# Patient Record
Sex: Female | Born: 1965 | Race: Black or African American | Hispanic: No | Marital: Single | Smoking: Former smoker
Health system: Southern US, Community
[De-identification: ages and names within clinical notes are randomized; demographics above are authoritative.]

## PROBLEM LIST (undated history)

## (undated) DIAGNOSIS — F419 Anxiety disorder, unspecified: Secondary | ICD-10-CM

## (undated) DIAGNOSIS — M1990 Unspecified osteoarthritis, unspecified site: Secondary | ICD-10-CM

## (undated) DIAGNOSIS — R519 Headache, unspecified: Secondary | ICD-10-CM

## (undated) DIAGNOSIS — D649 Anemia, unspecified: Secondary | ICD-10-CM

## (undated) DIAGNOSIS — R634 Abnormal weight loss: Secondary | ICD-10-CM

## (undated) DIAGNOSIS — F4310 Post-traumatic stress disorder, unspecified: Secondary | ICD-10-CM

## (undated) DIAGNOSIS — S060XAA Concussion with loss of consciousness status unknown, initial encounter: Secondary | ICD-10-CM

## (undated) DIAGNOSIS — F329 Major depressive disorder, single episode, unspecified: Secondary | ICD-10-CM

## (undated) DIAGNOSIS — R531 Weakness: Secondary | ICD-10-CM

## (undated) DIAGNOSIS — S060X9A Concussion with loss of consciousness of unspecified duration, initial encounter: Secondary | ICD-10-CM

## (undated) DIAGNOSIS — G43909 Migraine, unspecified, not intractable, without status migrainosus: Secondary | ICD-10-CM

## (undated) DIAGNOSIS — F32A Depression, unspecified: Secondary | ICD-10-CM

## (undated) DIAGNOSIS — K635 Polyp of colon: Secondary | ICD-10-CM

## (undated) DIAGNOSIS — R51 Headache: Secondary | ICD-10-CM

## (undated) DIAGNOSIS — R110 Nausea: Secondary | ICD-10-CM

## (undated) DIAGNOSIS — K6289 Other specified diseases of anus and rectum: Secondary | ICD-10-CM

## (undated) DIAGNOSIS — I10 Essential (primary) hypertension: Secondary | ICD-10-CM

## (undated) DIAGNOSIS — K5730 Diverticulosis of large intestine without perforation or abscess without bleeding: Secondary | ICD-10-CM

## (undated) DIAGNOSIS — K449 Diaphragmatic hernia without obstruction or gangrene: Secondary | ICD-10-CM

## (undated) DIAGNOSIS — K602 Anal fissure, unspecified: Secondary | ICD-10-CM

## (undated) HISTORY — DX: Abnormal weight loss: R63.4

## (undated) HISTORY — PX: COLONOSCOPY: SHX174

## (undated) HISTORY — DX: Depression, unspecified: F32.A

## (undated) HISTORY — DX: Anal fissure, unspecified: K60.2

## (undated) HISTORY — DX: Headache: R51

## (undated) HISTORY — DX: Diverticulosis of large intestine without perforation or abscess without bleeding: K57.30

## (undated) HISTORY — DX: Nausea: R11.0

## (undated) HISTORY — DX: Anemia, unspecified: D64.9

## (undated) HISTORY — DX: Weakness: R53.1

## (undated) HISTORY — DX: Other specified diseases of anus and rectum: K62.89

## (undated) HISTORY — DX: Major depressive disorder, single episode, unspecified: F32.9

## (undated) HISTORY — DX: Post-traumatic stress disorder, unspecified: F43.10

## (undated) HISTORY — PX: ESOPHAGOGASTRODUODENOSCOPY: SHX1529

## (undated) HISTORY — DX: Diaphragmatic hernia without obstruction or gangrene: K44.9

## (undated) HISTORY — DX: Anxiety disorder, unspecified: F41.9

## (undated) HISTORY — DX: Unspecified osteoarthritis, unspecified site: M19.90

## (undated) HISTORY — DX: Essential (primary) hypertension: I10

## (undated) HISTORY — DX: Polyp of colon: K63.5

## (undated) HISTORY — DX: Migraine, unspecified, not intractable, without status migrainosus: G43.909

## (undated) HISTORY — DX: Headache, unspecified: R51.9

---

## 1998-06-17 ENCOUNTER — Inpatient Hospital Stay (HOSPITAL_COMMUNITY): Admission: AD | Admit: 1998-06-17 | Discharge: 1998-06-18 | Payer: Self-pay | Admitting: *Deleted

## 1999-07-07 ENCOUNTER — Encounter: Payer: Self-pay | Admitting: Emergency Medicine

## 1999-07-07 ENCOUNTER — Emergency Department (HOSPITAL_COMMUNITY): Admission: EM | Admit: 1999-07-07 | Discharge: 1999-07-08 | Payer: Self-pay | Admitting: Emergency Medicine

## 2000-01-05 ENCOUNTER — Emergency Department (HOSPITAL_COMMUNITY): Admission: EM | Admit: 2000-01-05 | Discharge: 2000-01-05 | Payer: Self-pay | Admitting: Emergency Medicine

## 2000-01-05 ENCOUNTER — Encounter: Payer: Self-pay | Admitting: Emergency Medicine

## 2000-11-26 ENCOUNTER — Emergency Department: Admission: EM | Admit: 2000-11-26 | Discharge: 2000-11-26 | Payer: Self-pay | Admitting: Emergency Medicine

## 2002-04-30 ENCOUNTER — Other Ambulatory Visit: Admission: RE | Admit: 2002-04-30 | Discharge: 2002-04-30 | Payer: Self-pay | Admitting: Internal Medicine

## 2003-02-11 ENCOUNTER — Emergency Department (HOSPITAL_COMMUNITY): Admission: EM | Admit: 2003-02-11 | Discharge: 2003-02-11 | Payer: Self-pay | Admitting: *Deleted

## 2003-02-20 ENCOUNTER — Observation Stay (HOSPITAL_COMMUNITY): Admission: RE | Admit: 2003-02-20 | Discharge: 2003-02-21 | Payer: Self-pay | Admitting: General Surgery

## 2003-02-20 ENCOUNTER — Encounter (INDEPENDENT_AMBULATORY_CARE_PROVIDER_SITE_OTHER): Payer: Self-pay | Admitting: Specialist

## 2003-03-22 HISTORY — PX: CHOLECYSTECTOMY: SHX55

## 2004-02-10 ENCOUNTER — Emergency Department (HOSPITAL_COMMUNITY): Admission: EM | Admit: 2004-02-10 | Discharge: 2004-02-10 | Payer: Self-pay | Admitting: Emergency Medicine

## 2004-02-26 ENCOUNTER — Emergency Department (HOSPITAL_COMMUNITY): Admission: EM | Admit: 2004-02-26 | Discharge: 2004-02-26 | Payer: Self-pay | Admitting: Emergency Medicine

## 2004-03-07 ENCOUNTER — Emergency Department (HOSPITAL_COMMUNITY): Admission: EM | Admit: 2004-03-07 | Discharge: 2004-03-07 | Payer: Self-pay | Admitting: Emergency Medicine

## 2006-11-23 ENCOUNTER — Emergency Department (HOSPITAL_COMMUNITY): Admission: EM | Admit: 2006-11-23 | Discharge: 2006-11-23 | Payer: Self-pay | Admitting: Emergency Medicine

## 2008-04-15 ENCOUNTER — Emergency Department (HOSPITAL_COMMUNITY): Admission: EM | Admit: 2008-04-15 | Discharge: 2008-04-15 | Payer: Self-pay | Admitting: Emergency Medicine

## 2009-09-22 ENCOUNTER — Emergency Department (HOSPITAL_COMMUNITY): Admission: EM | Admit: 2009-09-22 | Discharge: 2009-09-22 | Payer: Self-pay | Admitting: Emergency Medicine

## 2010-01-08 ENCOUNTER — Emergency Department (HOSPITAL_COMMUNITY)
Admission: EM | Admit: 2010-01-08 | Discharge: 2010-01-08 | Payer: Self-pay | Source: Home / Self Care | Admitting: Emergency Medicine

## 2010-04-20 ENCOUNTER — Emergency Department (HOSPITAL_COMMUNITY)
Admission: EM | Admit: 2010-04-20 | Discharge: 2010-04-20 | Payer: Self-pay | Source: Home / Self Care | Admitting: Emergency Medicine

## 2010-04-20 LAB — POCT I-STAT, CHEM 8
BUN: 6 mg/dL (ref 6–23)
Calcium, Ion: 1.14 mmol/L (ref 1.12–1.32)
Chloride: 106 mEq/L (ref 96–112)
Creatinine, Ser: 1 mg/dL (ref 0.4–1.2)
TCO2: 27 mmol/L (ref 0–100)

## 2010-04-20 LAB — CBC
Hemoglobin: 11.2 g/dL — ABNORMAL LOW (ref 12.0–15.0)
MCH: 29 pg (ref 26.0–34.0)
MCV: 86.8 fL (ref 78.0–100.0)
Platelets: 407 10*3/uL — ABNORMAL HIGH (ref 150–400)
RBC: 3.86 MIL/uL — ABNORMAL LOW (ref 3.87–5.11)
WBC: 4.6 10*3/uL (ref 4.0–10.5)

## 2010-04-20 LAB — POCT CARDIAC MARKERS: Troponin i, poc: <0.05 ng/mL (ref 0.00–0.09)

## 2010-04-20 LAB — DIFFERENTIAL
Basophils Relative: 2 % — ABNORMAL HIGH (ref 0–1)
Lymphs Abs: 1.6 10*3/uL (ref 0.7–4.0)
Monocytes Relative: 7 % (ref 3–12)
Neutro Abs: 2.5 10*3/uL (ref 1.7–7.7)
Neutrophils Relative %: 54 % (ref 43–77)

## 2010-04-27 ENCOUNTER — Emergency Department (HOSPITAL_COMMUNITY): Payer: Medicaid Other

## 2010-04-27 ENCOUNTER — Emergency Department (HOSPITAL_COMMUNITY)
Admission: EM | Admit: 2010-04-27 | Discharge: 2010-04-27 | Disposition: A | Discharge status: Home / Self Care | Payer: Medicaid Other | Attending: Emergency Medicine | Admitting: Emergency Medicine

## 2010-04-27 DIAGNOSIS — R1013 Epigastric pain: Secondary | ICD-10-CM | POA: Insufficient documentation

## 2010-04-27 DIAGNOSIS — R079 Chest pain, unspecified: Secondary | ICD-10-CM | POA: Insufficient documentation

## 2010-04-27 DIAGNOSIS — F411 Generalized anxiety disorder: Secondary | ICD-10-CM | POA: Insufficient documentation

## 2010-04-27 DIAGNOSIS — I10 Essential (primary) hypertension: Secondary | ICD-10-CM | POA: Insufficient documentation

## 2010-04-27 LAB — COMPREHENSIVE METABOLIC PANEL
ALT: 40 U/L — ABNORMAL HIGH (ref 0–35)
Alkaline Phosphatase: 80 U/L (ref 39–117)
BUN: 7 mg/dL (ref 6–23)
CO2: 24 mEq/L (ref 19–32)
GFR calc non Af Amer: >60 mL/min (ref >60)
Glucose, Bld: 115 mg/dL — ABNORMAL HIGH (ref 70–99)
Potassium: 4.2 mEq/L (ref 3.5–5.1)
Sodium: 141 mEq/L (ref 135–145)
Total Bilirubin: 0.2 mg/dL — ABNORMAL LOW (ref 0.3–1.2)
Total Protein: 7.3 g/dL (ref 6.0–8.3)

## 2010-04-27 LAB — CBC
HCT: 33.7 % — ABNORMAL LOW (ref 36.0–46.0)
Hemoglobin: 11 g/dL — ABNORMAL LOW (ref 12.0–15.0)
MCV: 87.5 fL (ref 78.0–100.0)
RDW: 15.1 % (ref 11.5–15.5)
WBC: 4.4 10*3/uL (ref 4.0–10.5)

## 2010-04-27 LAB — POCT CARDIAC MARKERS: Myoglobin, poc: 45.8 ng/mL (ref 12–200)

## 2010-04-27 LAB — POCT PREGNANCY, URINE: Preg Test, Ur: NEGATIVE

## 2010-05-30 ENCOUNTER — Emergency Department (HOSPITAL_COMMUNITY): Payer: Medicaid Other

## 2010-05-30 ENCOUNTER — Emergency Department (HOSPITAL_COMMUNITY)
Admission: EM | Admit: 2010-05-30 | Discharge: 2010-05-30 | Disposition: A | Discharge status: Home / Self Care | Payer: Medicaid Other | Attending: Emergency Medicine | Admitting: Emergency Medicine

## 2010-05-30 DIAGNOSIS — IMO0002 Reserved for concepts with insufficient information to code with codable children: Secondary | ICD-10-CM | POA: Insufficient documentation

## 2010-05-30 DIAGNOSIS — R51 Headache: Secondary | ICD-10-CM | POA: Insufficient documentation

## 2010-05-30 DIAGNOSIS — F411 Generalized anxiety disorder: Secondary | ICD-10-CM | POA: Insufficient documentation

## 2010-05-30 DIAGNOSIS — S0990XA Unspecified injury of head, initial encounter: Secondary | ICD-10-CM | POA: Insufficient documentation

## 2010-05-30 DIAGNOSIS — I10 Essential (primary) hypertension: Secondary | ICD-10-CM | POA: Insufficient documentation

## 2010-06-01 ENCOUNTER — Emergency Department (HOSPITAL_COMMUNITY)
Admission: EM | Admit: 2010-06-01 | Discharge: 2010-06-01 | Disposition: A | Discharge status: Home / Self Care | Payer: Medicaid Other | Attending: Emergency Medicine | Admitting: Emergency Medicine

## 2010-06-01 DIAGNOSIS — I10 Essential (primary) hypertension: Secondary | ICD-10-CM | POA: Insufficient documentation

## 2010-06-01 DIAGNOSIS — R51 Headache: Secondary | ICD-10-CM | POA: Insufficient documentation

## 2010-06-01 DIAGNOSIS — R110 Nausea: Secondary | ICD-10-CM | POA: Insufficient documentation

## 2010-06-06 LAB — CBC
HCT: 29 % — ABNORMAL LOW (ref 36.0–46.0)
Hemoglobin: 9.8 g/dL — ABNORMAL LOW (ref 12.0–15.0)
MCH: 28.4 pg (ref 26.0–34.0)
MCHC: 33.6 g/dL (ref 30.0–36.0)
MCV: 84.6 fL (ref 78.0–100.0)
RDW: 17.3 % — ABNORMAL HIGH (ref 11.5–15.5)

## 2010-06-06 LAB — POCT I-STAT, CHEM 8
BUN: 6 mg/dL (ref 6–23)
Chloride: 110 mEq/L (ref 96–112)
Glucose, Bld: 85 mg/dL (ref 70–99)
HCT: 31 % — ABNORMAL LOW (ref 36.0–46.0)
Potassium: 3.9 mEq/L (ref 3.5–5.1)

## 2010-06-06 LAB — DIFFERENTIAL
Basophils Absolute: 0 10*3/uL (ref 0.0–0.1)
Basophils Relative: 1 % (ref 0–1)
Eosinophils Relative: 1 % (ref 0–5)
Monocytes Absolute: 0.4 10*3/uL (ref 0.1–1.0)
Monocytes Relative: 10 % (ref 3–12)
Neutro Abs: 1.8 10*3/uL (ref 1.7–7.7)

## 2010-06-06 LAB — POCT CARDIAC MARKERS: CKMB, poc: <1 ng/mL — ABNORMAL LOW (ref 1.0–8.0)

## 2010-06-14 ENCOUNTER — Emergency Department (HOSPITAL_COMMUNITY)
Admission: EM | Admit: 2010-06-14 | Discharge: 2010-06-14 | Disposition: A | Discharge status: Home / Self Care | Payer: Medicaid Other | Attending: Emergency Medicine | Admitting: Emergency Medicine

## 2010-06-14 DIAGNOSIS — IMO0002 Reserved for concepts with insufficient information to code with codable children: Secondary | ICD-10-CM | POA: Insufficient documentation

## 2010-06-14 DIAGNOSIS — F411 Generalized anxiety disorder: Secondary | ICD-10-CM | POA: Insufficient documentation

## 2010-06-14 DIAGNOSIS — S0990XA Unspecified injury of head, initial encounter: Secondary | ICD-10-CM | POA: Insufficient documentation

## 2010-06-14 DIAGNOSIS — I10 Essential (primary) hypertension: Secondary | ICD-10-CM | POA: Insufficient documentation

## 2010-06-15 ENCOUNTER — Emergency Department (HOSPITAL_COMMUNITY): Payer: Medicaid Other

## 2010-06-15 ENCOUNTER — Emergency Department (HOSPITAL_COMMUNITY)
Admission: EM | Admit: 2010-06-15 | Discharge: 2010-06-15 | Disposition: A | Discharge status: Home / Self Care | Payer: Medicaid Other | Attending: Emergency Medicine | Admitting: Emergency Medicine

## 2010-06-15 DIAGNOSIS — R071 Chest pain on breathing: Secondary | ICD-10-CM | POA: Insufficient documentation

## 2010-06-15 DIAGNOSIS — R0602 Shortness of breath: Secondary | ICD-10-CM | POA: Insufficient documentation

## 2010-06-15 DIAGNOSIS — F411 Generalized anxiety disorder: Secondary | ICD-10-CM | POA: Insufficient documentation

## 2010-06-15 DIAGNOSIS — Z7982 Long term (current) use of aspirin: Secondary | ICD-10-CM | POA: Insufficient documentation

## 2010-06-15 DIAGNOSIS — I10 Essential (primary) hypertension: Secondary | ICD-10-CM | POA: Insufficient documentation

## 2010-06-15 DIAGNOSIS — IMO0002 Reserved for concepts with insufficient information to code with codable children: Secondary | ICD-10-CM | POA: Insufficient documentation

## 2010-06-15 DIAGNOSIS — R070 Pain in throat: Secondary | ICD-10-CM | POA: Insufficient documentation

## 2010-06-15 DIAGNOSIS — R51 Headache: Secondary | ICD-10-CM | POA: Insufficient documentation

## 2010-07-03 ENCOUNTER — Emergency Department (HOSPITAL_COMMUNITY)
Admission: EM | Admit: 2010-07-03 | Discharge: 2010-07-03 | Disposition: A | Discharge status: Home / Self Care | Payer: Medicaid Other | Attending: Emergency Medicine | Admitting: Emergency Medicine

## 2010-07-03 DIAGNOSIS — F0781 Postconcussional syndrome: Secondary | ICD-10-CM | POA: Insufficient documentation

## 2010-07-03 DIAGNOSIS — R51 Headache: Secondary | ICD-10-CM | POA: Insufficient documentation

## 2010-07-03 DIAGNOSIS — I10 Essential (primary) hypertension: Secondary | ICD-10-CM | POA: Insufficient documentation

## 2010-07-03 DIAGNOSIS — F411 Generalized anxiety disorder: Secondary | ICD-10-CM | POA: Insufficient documentation

## 2010-07-03 DIAGNOSIS — R112 Nausea with vomiting, unspecified: Secondary | ICD-10-CM | POA: Insufficient documentation

## 2010-07-05 LAB — POCT I-STAT, CHEM 8
BUN: 7 mg/dL (ref 6–23)
Calcium, Ion: 1.06 mmol/L — ABNORMAL LOW (ref 1.12–1.32)
Chloride: 106 mEq/L (ref 96–112)
Creatinine, Ser: 0.7 mg/dL (ref 0.4–1.2)
Glucose, Bld: 98 mg/dL (ref 70–99)
HCT: 34 % — ABNORMAL LOW (ref 36.0–46.0)
Hemoglobin: 11.6 g/dL — ABNORMAL LOW (ref 12.0–15.0)
Potassium: 4.3 mEq/L (ref 3.5–5.1)
Sodium: 135 mEq/L (ref 135–145)
TCO2: 22 mmol/L (ref 0–100)

## 2010-07-05 LAB — POCT CARDIAC MARKERS
Myoglobin, poc: 30.7 ng/mL (ref 12–200)
Troponin i, poc: <0.05 ng/mL (ref 0.00–0.09)

## 2010-07-30 ENCOUNTER — Emergency Department (HOSPITAL_COMMUNITY)
Admission: EM | Admit: 2010-07-30 | Discharge: 2010-07-30 | Disposition: A | Discharge status: Home / Self Care | Payer: Medicaid Other | Attending: Emergency Medicine | Admitting: Emergency Medicine

## 2010-07-30 DIAGNOSIS — F411 Generalized anxiety disorder: Secondary | ICD-10-CM | POA: Insufficient documentation

## 2010-08-06 NOTE — Op Note (Signed)
NAME:  Leah Fox, Leah Fox                        ACCOUNT NO.:  [id number]   MEDICAL RECORD NO.:  [id number]                   PATIENT TYPE:  OBV   LOCATION:  0457                                 FACILITY:  [facility]   PHYSICIAN:  Ollen Gross. Vernell Morgans, M.D.              DATE OF BIRTH:  1965-07-06   DATE OF PROCEDURE:  02/26/2003  DATE OF DISCHARGE:  02/21/2003                                 OPERATIVE REPORT   PREOPERATIVE DIAGNOSIS:  Gallstones.   POSTOPERATIVE DIAGNOSIS:  Gallstones.   PROCEDURE:  Laparoscopic cholecystectomy.   SURGEON:  Ollen Gross. Carolynne Edouard, M.D.   ASSISTANT:  Abigail Miyamoto, M.D.   ANESTHESIA:  General endotracheal.   DESCRIPTION OF PROCEDURE:  After informed consent was obtained, the patient  was brought to the operating room, placed in the supine position on the  operating room table.  After adequate induction of general anesthesia, the  patient's abdomen was prepped with Betadine and draped in the usual sterile  manner.  The area above the umbilicus was then infiltrated with 0.25%  Marcaine, a small incision was made with the 15 blade knife.  This incision  was carried down through the subcutaneous tissue bluntly with a Kelly clamp  and [employer] retractors until the linea alba was identified.  The linea  alba was incised with the 15 blade knife and each side was grasped with  Kocher clamps and elevated anteriorly.  The preperitoneal space was then  probed bluntly with a hemostat until the peritoneum was opened and access  was gained to the abdominal cavity. A #0 Vicryl pursestring stitch was  placed in the fascia surrounding the opening, Hasson cannula was placed  through the opening and anchored in place with the previously placed Vicryl  pursestring stitch.  The abdomen was then insufflated with carbon dioxide  without difficulty.  The patient was placed in a head up position and she  tolerated this well.  Next the epigastric region was then infiltrated with  0.25%  Marcaine and a small incision was made with a 15 blade knife and a 10  mm port was placed bluntly through this incision into the abdominal cavity  under direct vision.  The laparoscope was placed through the Hasson cannula  and the right upper quadrant was inspected and the dome of the gallbladder  and liver were readily identified. Sites were then chosen laterally on the  right side of the abdomen for placement of 5 mm ports.  Each of these areas  was infiltrated with 0.25% Marcaine.  Small stab incisions were made with  the 15 blade knife and 5 mm ports were placed bluntly through these  incisions into the abdominal cavity under direct vision.  Blunt graspers  were placed through the lateral most 5 mm port and used to grasp the dome of  the gallbladder and elevate it anteriorly and superiorly. Another blunt  grasper was placed through the other  5 mm port and used to retract on the  body and neck of the gallbladder.  A dissector was then placed through the  epigastric port and using the electrocautery, the peritoneal reflection of  the gallbladder neck was opened.  Blunt dissection was then carried out in  this area until the gallbladder neck cystic duct junction was readily  identified and a good window was created. Care was taken to keep the common  duct medial to this dissection.  Two clips were placed proximally on the  cystic duct and one distally and the duct was divided between the two sets  of clips.  Posterior to this, the cystic artery was identified again  dissected bluntly in a circumferential manner until a good window was  created.  Two clips were then placed proximally and one distally on the  artery and the artery was divided between the two.  Next a laparoscopic hook  cautery device was used to separate the gallbladder from the liver bed.  Prior to completely detaching the gallbladder from the liver bed, the liver  bed was inspected and several small bleeding points were  coagulated with the  electrocautery until the area was completely hemostatic.  The gallbladder  was then detached the rest of the way from the liver bed without difficulty.  The laparoscope was then moved through the epigastric port and a gallbladder  grasper was placed through the Hasson cannula and used to grasp the neck of  the gallbladder.  The gallbladder with the Hasson cannula was then removed  through the supraumbilical port without difficulty.  The fascial defect was  closed with the previously placed Vicryl pursestring stitch as well as with  another #0 Vicryl interrupted stitch.  The rest of the ports were removed  under direct vision and all were found to be hemostatic.  The gas was  allowed to escape.  The rest of the skin incisions were closed with  interrupted 4-0 Monocryl subcuticular stitches, Benzoin and Steri-Strips  were applied.  The patient tolerated the procedure well. At the end of the  case, all needle, sponge and instrument counts were correct.  The patient  was awakened and taken to the recovery room in stable condition.                                               Ollen Gross. Vernell Morgans, M.D.    PST/MEDQ  D:  02/26/2003  T:  02/26/2003  Job:  [id number]

## 2010-08-22 ENCOUNTER — Emergency Department (HOSPITAL_COMMUNITY)
Admission: EM | Admit: 2010-08-22 | Discharge: 2010-08-22 | Disposition: A | Discharge status: Home / Self Care | Payer: Medicaid Other | Attending: Emergency Medicine | Admitting: Emergency Medicine

## 2010-08-22 DIAGNOSIS — R51 Headache: Secondary | ICD-10-CM | POA: Insufficient documentation

## 2010-08-22 DIAGNOSIS — I10 Essential (primary) hypertension: Secondary | ICD-10-CM | POA: Insufficient documentation

## 2010-08-22 DIAGNOSIS — Z79899 Other long term (current) drug therapy: Secondary | ICD-10-CM | POA: Insufficient documentation

## 2010-09-27 ENCOUNTER — Ambulatory Visit (INDEPENDENT_AMBULATORY_CARE_PROVIDER_SITE_OTHER): Payer: Medicaid Other | Admitting: Surgery

## 2010-10-13 ENCOUNTER — Emergency Department (HOSPITAL_COMMUNITY)
Admission: EM | Admit: 2010-10-13 | Discharge: 2010-10-13 | Disposition: A | Discharge status: Home / Self Care | Payer: Medicaid Other | Attending: Emergency Medicine | Admitting: Emergency Medicine

## 2010-10-13 DIAGNOSIS — R110 Nausea: Secondary | ICD-10-CM | POA: Insufficient documentation

## 2010-10-13 DIAGNOSIS — F411 Generalized anxiety disorder: Secondary | ICD-10-CM | POA: Insufficient documentation

## 2010-10-13 DIAGNOSIS — Z0000 Encounter for general adult medical examination without abnormal findings: Secondary | ICD-10-CM | POA: Insufficient documentation

## 2010-10-13 DIAGNOSIS — R51 Headache: Secondary | ICD-10-CM | POA: Insufficient documentation

## 2010-10-13 DIAGNOSIS — I10 Essential (primary) hypertension: Secondary | ICD-10-CM | POA: Insufficient documentation

## 2010-12-31 LAB — DIFFERENTIAL
Basophils Absolute: 0.2 — ABNORMAL HIGH
Basophils Relative: 3 — ABNORMAL HIGH
Eosinophils Absolute: 0.1
Eosinophils Relative: 1
Lymphocytes Relative: 36
Lymphs Abs: 2.4
Monocytes Absolute: 0.5
Monocytes Relative: 7
Neutro Abs: 3.5
Neutrophils Relative %: 53

## 2010-12-31 LAB — CBC
RBC: 3.59 — ABNORMAL LOW
WBC: 6.6

## 2010-12-31 LAB — POCT CARDIAC MARKERS
CKMB, poc: <1 — ABNORMAL LOW
Myoglobin, poc: 34.3
Troponin i, poc: <0.05

## 2010-12-31 LAB — BASIC METABOLIC PANEL
BUN: 6
CO2: 25
Calcium: 9.2
Chloride: 102
Creatinine, Ser: 0.82
GFR calc Af Amer: >60
GFR calc non Af Amer: >60
Glucose, Bld: 96
Potassium: 3.9
Sodium: 134 — ABNORMAL LOW

## 2011-01-11 ENCOUNTER — Emergency Department (HOSPITAL_COMMUNITY)
Admission: EM | Admit: 2011-01-11 | Discharge: 2011-01-11 | Disposition: A | Discharge status: Home / Self Care | Payer: Medicaid Other | Attending: Emergency Medicine | Admitting: Emergency Medicine

## 2011-01-11 DIAGNOSIS — K6289 Other specified diseases of anus and rectum: Secondary | ICD-10-CM | POA: Insufficient documentation

## 2011-01-11 DIAGNOSIS — K644 Residual hemorrhoidal skin tags: Secondary | ICD-10-CM | POA: Insufficient documentation

## 2011-01-13 ENCOUNTER — Encounter (INDEPENDENT_AMBULATORY_CARE_PROVIDER_SITE_OTHER): Payer: Self-pay | Admitting: Surgery

## 2011-01-13 ENCOUNTER — Ambulatory Visit (INDEPENDENT_AMBULATORY_CARE_PROVIDER_SITE_OTHER): Payer: Medicaid Other | Admitting: Surgery

## 2011-01-13 VITALS — BP 122/86 | HR 80 | Temp 97.6°F | Resp 16 | Ht 64.0 in | Wt 195.2 lb

## 2011-01-13 DIAGNOSIS — K5900 Constipation, unspecified: Secondary | ICD-10-CM | POA: Insufficient documentation

## 2011-01-13 DIAGNOSIS — K602 Anal fissure, unspecified: Secondary | ICD-10-CM | POA: Insufficient documentation

## 2011-01-13 MED ORDER — AMBULATORY NON FORMULARY MEDICATION: 1.0000 "application " | Freq: Four times a day (QID) | Status: DC

## 2011-01-13 NOTE — Patient Instructions (Addendum)
Use the Diltiazem crean to heal heal the fissure at the anus (should work over 3 weeks).  Call if not healed in 3 weeks.  Anal Fissure, Adult An anal fissure is a small tear or crack in the skin around the anus. Bleeding from a fissure usually stops on its own within a few minutes. However, bleeding will often reoccur with each bowel movement until the crack heals.  CAUSES   Passing large, hard stools.   Frequent diarrheal stools.   Constipation.   Inflammatory bowel disease (Crohn's disease or ulcerative colitis).   Infections.   Anal sex.  SYMPTOMS   Small amounts of blood seen on your stools, on toilet paper, or in the toilet after a bowel movement.   Rectal bleeding.   Painful bowel movements.   Itching or irritation around the anus.  DIAGNOSIS Your caregiver will examine the anal area. An anal fissure can usually be seen with careful inspection. A rectal exam may be performed and a short tube (anoscope) may be used to examine the anal canal. TREATMENT   You may be instructed to take fiber supplements. These supplements can soften your stool to help make bowel movements easier.   Sitz baths may be recommended to help heal the tear. Do not use soap in the sitz baths.   A medicated cream or ointment may be prescribed to lessen discomfort.  HOME CARE INSTRUCTIONS   Maintain a diet high in fruits, whole grains, and vegetables. Avoid constipating foods like bananas and dairy products.   Take sitz baths as directed by your caregiver.   Drink enough fluids to keep your urine clear or pale yellow.   Only take over-the-counter or prescription medicines for pain, discomfort, or fever as directed by your caregiver. Do not take aspirin as this may increase bleeding.   Do not use ointments containing numbing medications (anesthetics) or hydrocortisone. They could slow healing.  SEEK MEDICAL CARE IF:   Your fissure is not completely healed within 3 days.   You have further  bleeding.   You have a fever.   You have diarrhea mixed with blood.   You have pain.   Your problem is getting worse rather than better.  MAKE SURE YOU:   Understand these instructions.   Will watch your condition.   Will get help right away if you are not doing well or get worse.  Document Released: 03/07/2005 Document Revised: 11/17/2010 Document Reviewed: 08/22/2010 [facility] Patient Information 2012 [place], [place].  GETTING TO GOOD BOWEL HEALTH. Irregular bowel habits such as constipation and diarrhea can lead to many problems over time.  Having one soft bowel movement a day is the most important way to prevent further problems.  The anorectal canal is designed to handle stretching and feces to safely manage our ability to get rid of solid waste (feces, poop, stool) out of our body.  BUT, hard constipated stools can act like ripping concrete bricks and diarrhea can be a burning fire to this very sensitive area of our body, causing inflamed hemorrhoids, anal fissures, increasing risk is perirectal abscesses, abdominal pain/bloating, an making irritable bowel worse.     The goal: ONE SOFT BOWEL MOVEMENT A DAY!  To have soft, regular bowel movements:    Drink at least 8 tall glasses of water a day.     Take plenty of fiber.  Fiber is the undigested part of plant food that passes into the colon, acting s "natures broom" to encourage bowel motility and movement.  Fiber can absorb and hold large amounts of water. This results in a larger, bulkier stool, which is soft and easier to pass. Work gradually over several weeks up to 6 servings a day of fiber (25g a day even more if needed) in the form of: o Vegetables -- Root (potatoes, carrots, turnips), leafy green (lettuce, salad greens, celery, spinach), or cooked high residue (cabbage, broccoli, etc) o Fruit -- Fresh (unpeeled skin & pulp), Dried (prunes, apricots, cherries, etc ),  or stewed ( applesauce)  o Whole grain breads, pasta, etc  (whole wheat)  o Bran cereals    Bulking Agents -- This type of water-retaining fiber generally is easily obtained each day by one of the following:  o Psyllium bran -- The psyllium plant is remarkable because its ground seeds can retain so much water. This product is available as Metamucil, Konsyl, Effersyllium, Per Diem Fiber, or the less expensive generic preparation in drug and health food stores. Although labeled a laxative, it really is not a laxative.  o Methylcellulose -- This is another fiber derived from wood which also retains water. It is available as Citrucel. o Polyethylene Glycol - and "artificial" fiber commonly called Miralax or Glycolax.  It is helpful for people with gassy or bloated feelings with regular fiber o Flax Seed - a less gassy fiber than psyllium   No reading or other relaxing activity while on the toilet. If bowel movements take longer than 5 minutes, you are too constipated   AVOID CONSTIPATION.  High fiber and water intake usually takes care of this.  Sometimes a laxative is needed to stimulate more frequent bowel movements, but    Laxatives are not a good long-term solution as it can wear the colon out. o Osmotics (Milk of Magnesia, Fleets phosphosoda, Magnesium citrate, MiraLax, GoLytely) are safer than  o Stimulants (Senokot, Castor Oil, Dulcolax, Ex Lax)    o Do not take laxatives for more than 7days in a row.    IF SEVERELY CONSTIPATED, try a Bowel Retraining Program: o Do not use laxatives.  o Eat a diet high in roughage, such as bran cereals and leafy vegetables.  o Drink six (6) ounces of prune or apricot juice each morning.  o Eat two (2) large servings of stewed fruit each day.  o Take one (1) heaping tablespoon of a psyllium-based bulking agent twice a day. Use sugar-free sweetener when possible to avoid excessive calories.  o Eat a normal breakfast.  o Set aside 15 minutes after breakfast to sit on the toilet, but do not strain to have a bowel  movement.  o If you do not have a bowel movement by the third day, use an enema and repeat the above steps.    Controlling diarrhea o Switch to liquids and simpler foods for a few days to avoid stressing your intestines further. o Avoid dairy products (especially milk & ice cream) for a short time.  The intestines often can lose the ability to digest lactose when stressed. o Avoid foods that cause gassiness or bloating.  Typical foods include beans and other legumes, cabbage, broccoli, and dairy foods.  Every person has some sensitivity to other foods, so listen to our body and avoid those foods that trigger problems for you. o Adding fiber (Citrucel, Metamucil, psyllium, Miralax) gradually can help thicken stools by absorbing excess fluid and retrain the intestines to act more normally.  Slowly increase the dose over a few weeks.  Too much fiber too soon  can backfire and cause cramping & bloating. o Probiotics (such as active yogurt, Align, etc) may help repopulate the intestines and colon with normal bacteria and calm down a sensitive digestive tract.  Most studies show it to be of mild help, though, and such products can be costly. o Medicines:   Bismuth subsalicylate (ex. Kayopectate, Pepto Bismol) every 30 minutes for up to 6 doses can help control diarrhea.  Avoid if pregnant.   Loperamide (Immodium) can slow down diarrhea.  Start with two tablets (4mg  total) first and then try one tablet every 6 hours.  Avoid if you are having fevers or severe pain.  If you are not better or start feeling worse, stop all medicines and call your doctor for advice o Call your doctor if you are getting worse or not better.  Sometimes further testing (cultures, endoscopy, X-ray studies, bloodwork, etc) may be needed to help diagnose and treat the cause of the diarrhea. o

## 2011-01-13 NOTE — Progress Notes (Signed)
Subjective:     Patient ID: Leah Fox, female   DOB: Aug 03, 1965, 45 y.o.   MRN: [id number]  HPI  Patient Care Team: Eustace Moore as PCP - General (Infectious Diseases)  This patient is a 45 y.o.female who presents today for surgical evaluation.   Reason for visit: Severe anal pain. Probable hemorrhoids.  Patient is a woman who has not had too many and more problems. She nor this about every day. However she is an adjustment in a lot of her medications for other reasons. She's become more constipated. She feels that she's had increased anal pain. She often gets cramping & spasming. She's tried some warm soaks and some over-the-counter ointment. It did not help. She went to the emergency room. They start her on hydrocortisone cream. She thinks it helps a little bit.  Her main complaint is that is very painful to have a bowel movement. No major bleeding. Some burning and irritation as well. She's feeling constipated and is afraid to have a bowel movement. Her bili gets some crampy pain as well. She's never had a colonoscopy. She's never had problems on this before. No hemorrhoids or other anorectal interventions before  Past Medical History  Diagnosis Date  . Arthritis   . Generalized headaches   . Chest pain   . Abdominal pain   . Constipation   . Nausea   . Rectal pain   . Weakness   . Weight loss, unintentional   . Hemorrhoids     Past Surgical History  Procedure Date  . Cholecystectomy 2005    History   Social History  . Marital Status: Single    Spouse Name: N/A    Number of Children: N/A  . Years of Education: N/A   Occupational History  . Not on file.   Social History Main Topics  . Smoking status: Current Everyday Smoker    Types: Cigars  . Smokeless tobacco: Never Used  . Alcohol Use: Yes     occasional  . Drug Use: No  . Sexually Active: Not on file   Other Topics Concern  . Not on file   Social History Narrative  . No narrative on file     Family History  Problem Relation Age of Onset  . Emphysema Father 16    Current outpatient prescriptions:BENZTROPINE MESYLATE PO, Take 1 mg by mouth 2 (two) times daily.  , Disp: , Rfl: ;  LORazepam (ATIVAN) 1 MG tablet, Take 1 mg by mouth every 8 (eight) hours.  , Disp: , Rfl: ;  omeprazole (PRILOSEC) 40 MG capsule, Take 40 mg by mouth at bedtime.  , Disp: , Rfl: ;  ondansetron (ZOFRAN) 8 MG tablet, Take 8 mg by mouth 2 (two) times daily.  , Disp: , Rfl:  risperidone (RISPERDAL) 4 MG tablet, Take 4 mg by mouth daily. Pt takes 1/2 dosage in am and 1/2 in pm , Disp: , Rfl: ;  SUMATRIPTAN SUCCINATE [place], Inject 400 mg into the skin as needed.  , Disp: , Rfl: ;  tizanidine (ZANAFLEX) 2 MG capsule, Take 2 mg by mouth 3 (three) times daily as needed.  , Disp: , Rfl: ;  topiramate (TOPAMAX) 50 MG tablet, Take 50 mg by mouth 2 (two) times daily.  , Disp: , Rfl:  AMBULATORY NON FORMULARY MEDICATION, Place 1 application rectally 4 (four) times daily. Diltiazem 2% compounded suspension. 15g tube, Disp: 1 Tube, Rfl: 2  No Known Allergies     Review of  Systems  Constitutional: Positive for unexpected weight change. Negative for fever, chills, diaphoresis, appetite change and fatigue.  HENT: Negative for ear pain, sore throat, trouble swallowing, neck pain and ear discharge.   Eyes: Negative for photophobia, discharge and visual disturbance.  Respiratory: Negative for cough, choking, chest tightness and shortness of breath.   Cardiovascular: Negative for chest pain and palpitations.  Gastrointestinal: Positive for nausea, constipation and rectal pain. Negative for vomiting, abdominal pain, diarrhea, blood in stool, abdominal distention and anal bleeding.  Genitourinary: Negative for dysuria, frequency and difficulty urinating.  Musculoskeletal: Negative for myalgias and gait problem.  Skin: Negative for color change, pallor and rash.  Neurological: Positive for weakness and headaches. Negative for  dizziness, tremors, seizures, syncope, speech difficulty, light-headedness and numbness.  Hematological: Negative for adenopathy.  Psychiatric/Behavioral: Negative for confusion and agitation. The patient is not nervous/anxious.        Objective:   Physical Exam  Constitutional: She is oriented to person, place, and time. She appears well-developed and well-nourished. No distress.  HENT:  Head: Normocephalic.  Mouth/Throat: Oropharynx is clear and moist. No oropharyngeal exudate.  Eyes: Conjunctivae and EOM are normal. Pupils are equal, round, and reactive to light. No scleral icterus.  Neck: Normal range of motion. Neck supple. No tracheal deviation present.  Cardiovascular: Normal rate, regular rhythm and intact distal pulses.   Pulmonary/Chest: Effort normal and breath sounds normal. No respiratory distress. She exhibits no tenderness.  Abdominal: Soft. She exhibits no distension and no mass. There is no tenderness. There is no rebound and no guarding. Hernia confirmed negative in the right inguinal area and confirmed negative in the left inguinal area.  Genitourinary: Vagina normal. No vaginal discharge found.       Perianal skin clean with good hygiene.  No pruritis.   No abscess/fistula.  No pilonidal disease.    Barely tolerates digital exam. Tight sphincter tone. Posterior fissure felt with sentinel skin tag.   Musculoskeletal: Normal range of motion. She exhibits no tenderness.  Lymphadenopathy:    She has no cervical adenopathy.       Right: No inguinal adenopathy present.       Left: No inguinal adenopathy present.  Neurological: She is alert and oriented to person, place, and time. No cranial nerve deficit. She exhibits normal muscle tone. Coordination normal.  Skin: Skin is warm and dry. No rash noted. She is not diaphoretic. No erythema.  Psychiatric: She has a normal mood and affect. Her behavior is normal. Judgment and thought content normal.       Assessment:       Anal fissure with constipation     Plan:     The anatomy & physiology of the anorectal region was discussed.  The pathophysiology of anal fissure and differential diagnosis was discussed.  Natural history progression  was discussed.   I stressed the importance of a bowel regimen to have daily soft bowel movements to minimize progression of disease.   I discussed the use of warm soaks &  muscle relaxant, diltiazem, to help the anal sphincter relax, allow the spasming to stop, and help the tear/fissure to heal.  If non-operative treatment does not heal the fissure, I would recommend examination under anesthesia for better examination to confirm the diagnosis and treat by lateral internal sphincterotomy to allow the fissure to heal.  Technique, benefits, alternatives discussed.  Risks such as bleeding, pain, incontinence, recurrence, heart attack, death, and other risks were discussed.    Educational handouts further  explaining the pathology, treatment options, and bowel regimen were given as well.  The patient expressed understanding & will follow up 3 weeks, if much better than just PRN.  If the pain does not resolve in a few weeks or worsens, we should proceed with surgery.  She expressed understanding & appreciation.

## 2011-01-17 ENCOUNTER — Telehealth (INDEPENDENT_AMBULATORY_CARE_PROVIDER_SITE_OTHER): Payer: Self-pay | Admitting: General Surgery

## 2011-01-17 NOTE — Telephone Encounter (Signed)
Patient can not afford diltiazem ointment. Medicaid does not cover. She has been using hydrocortisone cream. Has been taking warm tub soaks 2x day. Is there anything else she can be doing? Please advise.

## 2011-03-22 HISTORY — PX: LUMBAR PUNCTURE: SHX1985

## 2011-04-20 ENCOUNTER — Encounter: Payer: Self-pay | Admitting: Internal Medicine

## 2011-04-26 ENCOUNTER — Encounter (HOSPITAL_COMMUNITY): Payer: Self-pay | Admitting: Family Medicine

## 2011-04-26 ENCOUNTER — Emergency Department (HOSPITAL_COMMUNITY)
Admission: EM | Admit: 2011-04-26 | Discharge: 2011-04-26 | Disposition: A | Discharge status: Home / Self Care | Payer: Medicaid Other | Attending: Emergency Medicine | Admitting: Emergency Medicine

## 2011-04-26 DIAGNOSIS — R110 Nausea: Secondary | ICD-10-CM

## 2011-04-26 DIAGNOSIS — Z79899 Other long term (current) drug therapy: Secondary | ICD-10-CM | POA: Insufficient documentation

## 2011-04-26 MED ORDER — ONDANSETRON 8 MG PO TBDP: 8.0000 mg | ORAL_TABLET | Freq: Three times a day (TID) | ORAL | Status: DC | PRN

## 2011-04-26 MED ORDER — ONDANSETRON 8 MG PO TBDP: 8.0000 mg | ORAL_TABLET | Freq: Three times a day (TID) | ORAL | Status: AC | PRN

## 2011-04-26 MED ORDER — METOCLOPRAMIDE HCL 10 MG PO TABS: 10.0000 mg | ORAL_TABLET | Freq: Four times a day (QID) | ORAL | Status: DC

## 2011-04-26 MED ORDER — ONDANSETRON 8 MG PO TBDP
8.0000 mg | ORAL_TABLET | Freq: Once | ORAL | Status: AC
Start: 2011-04-26 — End: 2011-04-26
  Administered 2011-04-26: 8 mg via ORAL
  Filled 2011-04-26: qty 1

## 2011-04-26 NOTE — ED Notes (Signed)
Per EMS: Pt called EMS to take her up here for a refill on her zofran.

## 2011-04-26 NOTE — ED Provider Notes (Signed)
History     CSN: [id number]  Arrival date & time 04/26/11  1029   First MD Initiated Contact with Patient 04/26/11 1038     10:59 AM HPI Patient reports chronic nausea for one year. Reports she's having difficulty obtaining Medicaid who pays for her Zofran. States she is here because she is unable to control her nausea. Denies ever having vomiting with this. Denies abdominal pain. Reports decreased appetite. States symptoms have not changed since her initial nausea that occurred 1 week ago. Requesting Zofran here prescription for different, cheaper antiemetics to go home with. The history is provided by the patient.    Past Medical History  Diagnosis Date  . Arthritis   . Generalized headaches   . Chest pain   . Abdominal pain   . Constipation   . Nausea   . Rectal pain   . Weakness   . Weight loss, unintentional   . Hemorrhoids     Past Surgical History  Procedure Date  . Cholecystectomy 2005  . Cholecystectomy 2005    Family History  Problem Relation Age of Onset  . Emphysema Father 78    History  Substance Use Topics  . Smoking status: Current Everyday Smoker    Types: Cigars  . Smokeless tobacco: Never Used  . Alcohol Use: Yes     occasional    OB History    Grav Para Term Preterm Abortions TAB SAB Ect Mult Living                  Review of Systems  Constitutional: Positive for appetite change. Negative for fever and chills.  Gastrointestinal: Positive for nausea. Negative for vomiting and abdominal pain.  Genitourinary: Negative for dysuria, urgency and frequency.    Allergies  Review of patient's allergies indicates no known allergies.  Home Medications   Current Outpatient Rx  Name Route Sig Dispense Refill  . BENZTROPINE MESYLATE 1 MG PO TABS Oral Take 1 mg by mouth 2 (two) times daily.    [name] CITALOPRAM HYDROBROMIDE 20 MG PO TABS Oral Take 10 mg by mouth daily.    [name] DICLOFENAC POTASSIUM 50 MG PO PACK Oral Take 50 mg by mouth as needed. For  pain    . LORAZEPAM 1 MG PO TABS Oral Take 1 mg by mouth every 8 (eight) hours.      [name] PYRIDOXINE HCL 25 MG PO TABS Oral Take 25 mg by mouth daily.    [name] RISPERIDONE 4 MG PO TABS Oral Take 4 mg by mouth daily. Pt takes 1/2 dosage in am and 1/2 in pm     . SUMATRIPTAN SUCCINATE 100 MG PO TABS Oral Take 100 mg by mouth every 2 (two) hours as needed. For headache    . TIZANIDINE HCL 2 MG PO CAPS Oral Take 2 mg by mouth 3 (three) times daily as needed.      . TOPIRAMATE 50 MG PO TABS Oral Take 50 mg by mouth 2 (two) times daily.        BP 154/108  Pulse 76  Temp(Src) 98.1 F (36.7 C) (Oral)  SpO2 100%  Physical Exam  Vitals reviewed. Constitutional: She is oriented to person, place, and time. Vital signs are normal. She appears well-developed and well-nourished. No distress.  HENT:  Head: Normocephalic and atraumatic.  Eyes: Pupils are equal, round, and reactive to light.  Neck: Neck supple.  Pulmonary/Chest: Effort normal.  Abdominal: Soft. Bowel sounds are normal. She exhibits no distension and no  mass. There is no tenderness. There is no rebound and no guarding.  Neurological: She is alert and oriented to person, place, and time.  Skin: Skin is warm and dry. No rash noted. No erythema. No pallor.  Psychiatric: She has a normal mood and affect. Her behavior is normal.    ED Course  Procedures   MDM  Patient reports she should feel to have filled by tomorrow. Will give prescription of 10 Reglan for discharge. No change in symptoms for one year. I do not feel we need to do any labs or imaging.        Thomasene Lot, PA-C 04/26/11 1109

## 2011-04-26 NOTE — ED Provider Notes (Signed)
Medical screening examination/treatment/procedure(s) were performed by non-physician practitioner and as supervising physician I was immediately available for consultation/collaboration.   Lyanne Co, MD 04/26/11 1213

## 2011-05-02 ENCOUNTER — Ambulatory Visit: Payer: Medicaid Other | Admitting: Internal Medicine

## 2011-05-05 ENCOUNTER — Encounter: Payer: Self-pay | Admitting: Internal Medicine

## 2011-05-09 ENCOUNTER — Ambulatory Visit: Payer: Medicaid Other | Admitting: Internal Medicine

## 2011-05-16 ENCOUNTER — Encounter: Payer: Self-pay | Admitting: Internal Medicine

## 2011-05-17 ENCOUNTER — Other Ambulatory Visit (INDEPENDENT_AMBULATORY_CARE_PROVIDER_SITE_OTHER): Payer: Medicaid Other

## 2011-05-17 ENCOUNTER — Encounter: Payer: Self-pay | Admitting: Internal Medicine

## 2011-05-17 ENCOUNTER — Telehealth: Payer: Self-pay | Admitting: Internal Medicine

## 2011-05-17 ENCOUNTER — Ambulatory Visit (INDEPENDENT_AMBULATORY_CARE_PROVIDER_SITE_OTHER): Payer: Medicaid Other | Admitting: Internal Medicine

## 2011-05-17 DIAGNOSIS — R110 Nausea: Secondary | ICD-10-CM

## 2011-05-17 DIAGNOSIS — F329 Major depressive disorder, single episode, unspecified: Secondary | ICD-10-CM | POA: Insufficient documentation

## 2011-05-17 DIAGNOSIS — F32A Depression, unspecified: Secondary | ICD-10-CM

## 2011-05-17 DIAGNOSIS — K625 Hemorrhage of anus and rectum: Secondary | ICD-10-CM

## 2011-05-17 DIAGNOSIS — R1110 Vomiting, unspecified: Secondary | ICD-10-CM

## 2011-05-17 DIAGNOSIS — F341 Dysthymic disorder: Secondary | ICD-10-CM

## 2011-05-17 MED ORDER — PEG-KCL-NACL-NASULF-NA ASC-C 100 G PO SOLR: 1.0000 | Freq: Once | ORAL | Status: DC

## 2011-05-17 MED ORDER — ONDANSETRON HCL 8 MG PO TABS: 8.0000 mg | ORAL_TABLET | Freq: Three times a day (TID) | ORAL | Status: AC | PRN

## 2011-05-17 NOTE — Telephone Encounter (Signed)
lmom for pt that I faxed her out of work letter to the number given.
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
## 2011-05-17 NOTE — Telephone Encounter (Signed)
Ins. also needs pre-auth. for her Zofran med.

## 2011-05-17 NOTE — Patient Instructions (Signed)
You have been scheduled for a colonoscopy/Endoscopy  with propofol. Please follow written instructions given to you at your visit today.  Please pick up your prep kit at the pharmacy within the next 1-3 days.  Your physician has requested that you go to the basement for lab work before leaving today.  We have sent the following medications to your pharmacy for you to pick up at your convenience: Zofran

## 2011-05-19 ENCOUNTER — Telehealth: Payer: Self-pay | Admitting: Gastroenterology

## 2011-05-19 ENCOUNTER — Other Ambulatory Visit: Payer: Self-pay | Admitting: Gastroenterology

## 2011-05-19 NOTE — Telephone Encounter (Signed)
Called [place] [employer] [name] to do a prior-auth for pt's Zofran.  Girl I spoke with had NO clue what she was doing or what I was talking about.  She took my name and number and said she will have the nurse call me back.

## 2011-05-25 ENCOUNTER — Encounter: Payer: Self-pay | Admitting: Internal Medicine

## 2011-05-26 ENCOUNTER — Telehealth: Payer: Self-pay | Admitting: Internal Medicine

## 2011-05-26 NOTE — Telephone Encounter (Signed)
Called pt twice to update her on her Prior-auth for Zofran, someone answers and says nothing.

## 2011-05-30 ENCOUNTER — Telehealth: Payer: Self-pay | Admitting: *Deleted

## 2011-05-30 NOTE — Telephone Encounter (Signed)
Called pt. After I called the pharmacy after several failed attempts to call [name] to get a PA for Leah Fox. Pharmacist said she needs to pick up an Rx from our office with it stating Brand name medically necessary written on it. They will not accept a fax. I called the Pt. lvm for her to call me back regarding this situation.

## 2011-05-30 NOTE — Telephone Encounter (Signed)
Patient called wanted to know if she has been approved foe her medication yet.Leah KitchenMarland KitchenStated she has been waiting 2 weeks

## 2011-05-31 ENCOUNTER — Other Ambulatory Visit: Payer: Self-pay | Admitting: Gastroenterology

## 2011-05-31 ENCOUNTER — Telehealth: Payer: Self-pay | Admitting: Gastroenterology

## 2011-05-31 MED ORDER — ONDANSETRON HCL 4 MG PO TABS: 4.0000 mg | ORAL_TABLET | Freq: Every day | ORAL | Status: DC | PRN

## 2011-05-31 NOTE — Telephone Encounter (Signed)
Spoke to pt regarding her prescription for Zofran, i told her I spoke with the pharmacist and they told me I need to have written on the Rx "brand name medically necessary" and it cannot be faxed, she said she cannot drive. I said I will mail it to the pharmacy, but this will delay her getting her medication. She said she understood.  Rx being mailed to [facility] [address], [place], [place] [address]

## 2011-06-02 ENCOUNTER — Telehealth: Payer: Self-pay | Admitting: Internal Medicine

## 2011-06-03 ENCOUNTER — Other Ambulatory Visit: Payer: Self-pay | Admitting: Gastroenterology

## 2011-06-03 MED ORDER — ONDANSETRON HCL 4 MG PO TABS: 4.0000 mg | ORAL_TABLET | Freq: Three times a day (TID) | ORAL | Status: AC

## 2011-06-03 NOTE — Telephone Encounter (Signed)
Spoke to Pharmacist, told him I will be resending Rx for Zofran again in the mail Dr. Rhea Belton to sign it on Monday when he is back in the office.

## 2011-06-06 ENCOUNTER — Encounter: Payer: Medicaid Other | Admitting: Physician Assistant

## 2011-06-06 ENCOUNTER — Telehealth: Payer: Self-pay | Admitting: Internal Medicine

## 2011-06-08 ENCOUNTER — Telehealth: Payer: Self-pay | Admitting: Internal Medicine

## 2011-06-08 NOTE — Telephone Encounter (Signed)
No charge. 

## 2011-06-09 ENCOUNTER — Encounter: Payer: Self-pay | Admitting: Internal Medicine

## 2011-06-13 ENCOUNTER — Telehealth: Payer: Self-pay | Admitting: Internal Medicine

## 2011-06-13 NOTE — Telephone Encounter (Signed)
Pt called to inquire about taking her daily medications today while prepping for her ECL tomorrow. Informed that she could take all her medications as prescribed today and tomorrow. Also inquired about prior authorization for Zofran needed with pharmacy. She states her pharmacist has informed her that Medicaid has denied the prescription and needs prior authorization. Stated that I would pass this message along to Dr. Lauro Franklin CMA for further investigation but also to speak with Dr. Rhea Belton tomorrow while here for procedure about medication options/TE

## 2011-06-14 ENCOUNTER — Ambulatory Visit (AMBULATORY_SURGERY_CENTER): Payer: Medicaid Other | Admitting: Internal Medicine

## 2011-06-14 ENCOUNTER — Encounter: Payer: Self-pay | Admitting: Internal Medicine

## 2011-06-14 VITALS — BP 141/85 | HR 73 | Temp 98.7°F | Resp 31 | Ht 62.0 in | Wt 196.0 lb

## 2011-06-14 DIAGNOSIS — D126 Benign neoplasm of colon, unspecified: Secondary | ICD-10-CM

## 2011-06-14 DIAGNOSIS — R110 Nausea: Secondary | ICD-10-CM

## 2011-06-14 DIAGNOSIS — R1110 Vomiting, unspecified: Secondary | ICD-10-CM

## 2011-06-14 DIAGNOSIS — K635 Polyp of colon: Secondary | ICD-10-CM

## 2011-06-14 DIAGNOSIS — K625 Hemorrhage of anus and rectum: Secondary | ICD-10-CM

## 2011-06-14 HISTORY — DX: Polyp of colon: K63.5

## 2011-06-14 MED ORDER — SODIUM CHLORIDE 0.9 % IV SOLN: 500.0000 mL | INTRAVENOUS | Status: AC

## 2011-06-14 NOTE — Progress Notes (Signed)
Patient did not experience any of the following events: a burn prior to discharge; a fall within the facility; wrong site/side/patient/procedure/implant event; or a hospital transfer or hospital admission upon discharge from the facility. (G8907) Patient did not have preoperative order for IV antibiotic SSI prophylaxis. (G8918)  

## 2011-06-14 NOTE — Op Note (Signed)
[facility] [address] [place], [place]  [address]  COLONOSCOPY PROCEDURE REPORT  PATIENT:  Leah Fox, Leah Fox  MR#:  [id number] BIRTHDATE:  04-Apr-1965, 46 yrs. old  GENDER:  female ENDOSCOPIST:  Carie Caddy. Demontez Novack, MD REF. BY:  Yisroel Ramming, M.D. PROCEDURE DATE:  06/14/2011 PROCEDURE:  Colonoscopy with snare polypectomy ASA CLASS:  Class II INDICATIONS:  rectal bleeding MEDICATIONS:   MAC sedation, administered by CRNA, propofol (Diprivan) 140 mg IV  DESCRIPTION OF PROCEDURE:   After the risks benefits and alternatives of the procedure were thoroughly explained, informed consent was obtained.  Digital rectal exam was performed and revealed no rectal masses.   The LB 180AL [id number] endoscope was introduced through the anus and advanced to the cecum, which was identified by both the appendix and ileocecal valve, without limitations.  The quality of the prep was good, using MoviPrep. The instrument was then slowly withdrawn as the colon was fully examined. <<PROCEDUREIMAGES>>  FINDINGS:  A 6 mm sessile polyp was found in the sigmoid colon. Polyp was snared without cautery. Retrieval was successful. Cold biopsy forceps were used to ensure complete removal.  Moderate diverticulosis was found in the right colon and left colon (sparing the transverse colon).  Internal Hemorrhoids were found. Retroflexed views in the rectum revealed no other findings other than those already described.    The scope was then withdrawn from the cecum and the procedure completed.  COMPLICATIONS:  None  ENDOSCOPIC IMPRESSION: 1) Sessile polyp in the sigmoid colon. Removed and sent to pathology. 2) Moderate diverticulosis in the right colon 3) Internal hemorrhoids  RECOMMENDATIONS: 1) Await pathology results 2) High fiber diet. 3) If the polyp removed today are proven to be adenomatous (pre-cancerous) polyps, you will need a repeat colonoscopy in 5 years. Otherwise you should continue to  follow colorectal cancer screening guidelines for "routine risk" patients with colonoscopy in 10 years. You will receive a letter within 1-2 weeks with the results of your biopsy as well as final recommendations. Please call my office if you have not received a letter after 3 weeks. 4) If rectal bleeding returns or is a problem, hemorrhoids medication, such as Analpram, can be prescribed.  Carie Caddy. Rhea Belton, MD  CC:  Yisroel Ramming, MD The Patient  n. eSIGNED:   Carie Caddy. Laiyah Exline at 06/14/2011 02:55 PM  Barbaraann Boys, [phone]

## 2011-06-14 NOTE — Progress Notes (Signed)
The pt tolerated the egd very well.  Maw  Pt is on her menstral cycle.  Her pad was moved slightly.  A clean pad was under the stretcher and the pt was advised pre-sedation. Maw

## 2011-06-14 NOTE — Op Note (Signed)
[facility] [address] [place], [place]  [address]  ENDOSCOPY PROCEDURE REPORT  PATIENT:  Leah, Fox  MR#:  [id number] BIRTHDATE:  May 16, 1965, 46 yrs. old  GENDER:  female ENDOSCOPIST:  Carie Caddy. Leibish Mcgregor, MD Referred by:  Yisroel Ramming, M.D. PROCEDURE DATE:  06/14/2011 PROCEDURE:  EGD, diagnostic [address] ASA CLASS:  Class II INDICATIONS:  nausea and vomiting MEDICATIONS:   MAC sedation, administered by CRNA, propofol (Diprivan) 310 mg IV TOPICAL ANESTHETIC:  none  DESCRIPTION OF PROCEDURE:   After the risks benefits and alternatives of the procedure were thoroughly explained, informed consent was obtained.  The LB [id number] endoscope was introduced through the mouth and advanced to the third portion of the duodenum, without limitations.  The instrument was slowly withdrawn as the mucosa was fully examined. <<PROCEDUREIMAGES>> The esophagus and gastroesophageal junction were completely normal in appearance.  A small hiatal hernia was found.  Otherwise normal stomach.  The duodenal bulb was normal in appearance, as was the postbulbar duodenum.    Retroflexed views revealed a hiatal hernia.    The scope was then withdrawn from the patient and the procedure completed.  COMPLICATIONS:  None  ENDOSCOPIC IMPRESSION: 1) Normal esophagus 2) Small hiatal hernia 3) Otherwise normal stomach 4) Normal duodenum  RECOMMENDATIONS: 1) Continue current medications 2) No findings in upper GI tract found to explain ongoing nausea and vomiting.  Carie Caddy. Rhea Belton, MD  CC:  The Patient Yisroel Ramming, MD  n. Rosalie DoctorCarie Caddy. Margaret Cockerill at 06/14/2011 02:48 PM  Barbaraann Boys, [phone]

## 2011-06-14 NOTE — Progress Notes (Signed)
The pt tolerated the colonoscopy very well also. Maw

## 2011-06-14 NOTE — Patient Instructions (Addendum)

## 2011-06-15 ENCOUNTER — Telehealth: Payer: Self-pay | Admitting: *Deleted

## 2011-06-15 ENCOUNTER — Telehealth: Payer: Self-pay | Admitting: Internal Medicine

## 2011-06-15 NOTE — Telephone Encounter (Signed)
Pt lmom asking  Dr Rhea Belton to write a letter stating he saw changes in her brain on the CT scan that are causing her nausea. Notified pt that Dr Rhea Belton will only say that he could not find cause for her nausea on EGD. Offered to give her Dr Lauro [facility] notes and her scan results; she asked me to mail them to her- done.

## 2011-06-15 NOTE — Telephone Encounter (Signed)
lmom for pt to call back

## 2011-06-15 NOTE — Telephone Encounter (Signed)
  Follow up Call-  Call back number 06/14/2011  Post procedure Call Back phone  # [phone]  Permission to leave phone message Yes     Patient questions:  Do you have a fever, pain , or abdominal swelling? no Pain Score  0 *  Have you tolerated food without any problems? no  Have you been able to return to your normal activities? yes  Do you have any questions about your discharge instructions: Diet   no Medications  no Follow up visit  no  Do you have questions or concerns about your Care? no  Actions: * If pain score is 4 or above: No action needed, pain <4.   Pt. States that she is still nauseated and has not eaten very much.  She has history of nausea and vomiting.  States that she has a rx. For Zofran but has not been delivered from pharmacy.  Advised To call office if needed.

## 2011-06-21 ENCOUNTER — Encounter: Payer: Self-pay | Admitting: Internal Medicine

## 2011-06-22 ENCOUNTER — Encounter: Payer: Self-pay | Admitting: Obstetrics and Gynecology

## 2011-06-24 ENCOUNTER — Telehealth: Payer: Self-pay | Admitting: Internal Medicine

## 2011-06-24 ENCOUNTER — Other Ambulatory Visit: Payer: Self-pay | Admitting: Gastroenterology

## 2011-06-24 MED ORDER — ONDANSETRON 8 MG PO TBDP: 8.0000 mg | ORAL_TABLET | Freq: Three times a day (TID) | ORAL | Status: AC | PRN

## 2011-07-04 ENCOUNTER — Telehealth: Payer: Self-pay | Admitting: Internal Medicine

## 2011-07-04 ENCOUNTER — Other Ambulatory Visit: Payer: Self-pay | Admitting: Gastroenterology

## 2011-07-04 MED ORDER — ONDANSETRON HCL 8 MG PO TABS: ORAL_TABLET | ORAL | Status: DC

## 2011-07-04 NOTE — Telephone Encounter (Signed)
Called Pharmacy, for insurance only every month have to send in Rx with "name brand only"  Rx sent.

## 2011-07-05 NOTE — Telephone Encounter (Signed)
Completed.

## 2011-07-07 ENCOUNTER — Other Ambulatory Visit: Payer: Self-pay | Admitting: Gastroenterology

## 2011-07-07 ENCOUNTER — Telehealth: Payer: Self-pay | Admitting: Internal Medicine

## 2011-07-07 MED ORDER — ZOFRAN ODT 8 MG PO TBDP: 8.0000 mg | ORAL_TABLET | Freq: Three times a day (TID) | ORAL | Status: DC | PRN

## 2011-07-08 ENCOUNTER — Encounter: Payer: Self-pay | Admitting: Obstetrics & Gynecology

## 2011-07-08 ENCOUNTER — Other Ambulatory Visit: Payer: Self-pay | Admitting: Gastroenterology

## 2011-07-08 ENCOUNTER — Telehealth: Payer: Self-pay | Admitting: Internal Medicine

## 2011-07-08 MED ORDER — ZOFRAN ODT 4 MG PO TBDP: 4.0000 mg | ORAL_TABLET | Freq: Three times a day (TID) | ORAL | Status: AC | PRN

## 2011-07-08 NOTE — Telephone Encounter (Signed)
Spoke to pt this morning. Let her know Rx has been sent again (4th) to the pharmacy with refills.

## 2011-07-08 NOTE — Telephone Encounter (Signed)
See other phone notes 07/08/2011

## 2011-07-27 ENCOUNTER — Telehealth: Payer: Self-pay | Admitting: Internal Medicine

## 2011-07-28 NOTE — Telephone Encounter (Signed)
See note from 07-08-11

## 2011-07-29 ENCOUNTER — Other Ambulatory Visit: Payer: Self-pay | Admitting: Gastroenterology

## 2011-07-29 ENCOUNTER — Telehealth: Payer: Self-pay | Admitting: Gastroenterology

## 2011-07-29 MED ORDER — METOCLOPRAMIDE HCL 5 MG PO TABS: 5.0000 mg | ORAL_TABLET | Freq: Four times a day (QID) | ORAL | Status: DC

## 2011-07-29 NOTE — Telephone Encounter (Signed)
Spoke to pt. About reglan for her nausea, since Zofran is on back order. She said she will try it. Sent Rx to pharmacy.

## 2011-08-02 ENCOUNTER — Telehealth: Payer: Self-pay | Admitting: Internal Medicine

## 2011-08-02 NOTE — Telephone Encounter (Signed)
Pt reports she woke up with her eye swollen this am. She has an appt with an opthomologist in the am and wants to know if they can get info from her chart if she needs to; advised pt to have them call for info.

## 2011-08-08 NOTE — Telephone Encounter (Signed)
See other note

## 2011-08-29 ENCOUNTER — Telehealth: Payer: Self-pay | Admitting: Internal Medicine

## 2011-08-29 ENCOUNTER — Other Ambulatory Visit: Payer: Self-pay | Admitting: Gastroenterology

## 2011-08-29 MED ORDER — METOCLOPRAMIDE HCL 5 MG PO TABS: 5.0000 mg | ORAL_TABLET | Freq: Three times a day (TID) | ORAL | Status: DC

## 2011-09-02 NOTE — Telephone Encounter (Signed)
See previous note

## 2011-09-05 ENCOUNTER — Telehealth: Payer: Self-pay | Admitting: Internal Medicine

## 2011-09-05 NOTE — Telephone Encounter (Signed)
Pt reports she has nausea again. She has been on Reglan, but it stopped working yesterday. Pt just got a refill on 08/29/11 and I asked if this is the new med and she stated yes. Pt also reports she see a neurologist at [facility] on 09/08/11. lmom at [facility] to call back to ask if pt has a different generic med.

## 2011-09-05 NOTE — Telephone Encounter (Signed)
Spoke with a Teacher, early years/pre at [employer] who stated they have not changed generic manufacturers; they still use TEVA. He reports pt as called him before c/o meds stating they aren't as effective. Spoke with pt and explained she should have the same generic Reglan tabs. She explained she's under a great deal of stress and maybe that's causing her problem. She will go to [facility] on Thursday and call for further problems.

## 2011-09-14 ENCOUNTER — Telehealth: Payer: Self-pay | Admitting: Internal Medicine

## 2011-09-14 MED ORDER — ONDANSETRON 8 MG PO TBDP: 8.0000 mg | ORAL_TABLET | Freq: Three times a day (TID) | ORAL | Status: AC | PRN

## 2011-09-14 NOTE — Addendum Note (Signed)
Addended by: Florene Glen on: 09/14/2011 03:23 PM   Modules accepted: Orders

## 2011-09-14 NOTE — Telephone Encounter (Signed)
Notified pt I ordered her Zofran; pt stated understanding. 

## 2011-09-14 NOTE — Telephone Encounter (Addendum)
Pt called back immediately to report she was reading the SE of Reglan and it's lists suicidal behavior and thoughts. Pt reports her " mind has been going crazy " and she bets that's it. When I asked about her nausea she reports she still has it; saw the md at [facility] yesterday who thinks it may be r/t her meds. He is increasing her Topamax and will try to wean her off the other meds. Since she won't take the reglan can we order the Zofran again. She was on Zofran 8mg  ODT, but reports if she can't get ODT she will take oral. Ok to order Dr Rhea Belton? Thanks.

## 2011-09-14 NOTE — Telephone Encounter (Signed)
Pt wanted a letter for instructions on Reglan dosing for disability forms. Mailed pt a copy of reglan script.

## 2011-09-14 NOTE — Telephone Encounter (Signed)
Yes, discontinue reglan. Okay with zofran 8 mg q8h prn nausea

## 2011-09-14 NOTE — Telephone Encounter (Signed)
lmom for pt to call back

## 2011-09-21 ENCOUNTER — Other Ambulatory Visit: Payer: Self-pay | Admitting: Internal Medicine

## 2011-09-23 ENCOUNTER — Telehealth: Payer: Self-pay | Admitting: Internal Medicine

## 2011-09-27 ENCOUNTER — Telehealth: Payer: Self-pay | Admitting: Gastroenterology

## 2011-09-27 NOTE — Telephone Encounter (Signed)
lvm for pt to call me back regarding her Rx.

## 2011-09-29 ENCOUNTER — Telehealth: Payer: Self-pay | Admitting: Gastroenterology

## 2011-09-29 ENCOUNTER — Other Ambulatory Visit: Payer: Self-pay | Admitting: Gastroenterology

## 2011-09-29 MED ORDER — ONDANSETRON 4 MG PO TBDP: 4.0000 mg | ORAL_TABLET | Freq: Three times a day (TID) | ORAL | Status: AC | PRN

## 2011-09-29 NOTE — Telephone Encounter (Signed)
Spoke to pt told her Rx was faxed to her pharmacy

## 2011-09-30 ENCOUNTER — Telehealth: Payer: Self-pay | Admitting: *Deleted

## 2011-09-30 NOTE — Telephone Encounter (Signed)
Called [facility] for patient to check status of RX.  Per [facility] is there and ready for patient to pick up.  Called patient and advised that RX is ready for pick up. Patient understood

## 2011-10-01 ENCOUNTER — Encounter (HOSPITAL_COMMUNITY): Payer: Self-pay | Admitting: *Deleted

## 2011-10-01 ENCOUNTER — Emergency Department (HOSPITAL_COMMUNITY): Payer: Medicaid Other

## 2011-10-01 ENCOUNTER — Emergency Department (HOSPITAL_COMMUNITY)
Admission: EM | Admit: 2011-10-01 | Discharge: 2011-10-02 | Disposition: A | Discharge status: Home / Self Care | Payer: Medicaid Other | Attending: Emergency Medicine | Admitting: Emergency Medicine

## 2011-10-01 DIAGNOSIS — F329 Major depressive disorder, single episode, unspecified: Secondary | ICD-10-CM | POA: Insufficient documentation

## 2011-10-01 DIAGNOSIS — R51 Headache: Secondary | ICD-10-CM

## 2011-10-01 DIAGNOSIS — Z79899 Other long term (current) drug therapy: Secondary | ICD-10-CM | POA: Insufficient documentation

## 2011-10-01 DIAGNOSIS — R42 Dizziness and giddiness: Secondary | ICD-10-CM | POA: Insufficient documentation

## 2011-10-01 DIAGNOSIS — I10 Essential (primary) hypertension: Secondary | ICD-10-CM | POA: Insufficient documentation

## 2011-10-01 DIAGNOSIS — R112 Nausea with vomiting, unspecified: Secondary | ICD-10-CM | POA: Insufficient documentation

## 2011-10-01 DIAGNOSIS — M129 Arthropathy, unspecified: Secondary | ICD-10-CM | POA: Insufficient documentation

## 2011-10-01 DIAGNOSIS — F3289 Other specified depressive episodes: Secondary | ICD-10-CM | POA: Insufficient documentation

## 2011-10-01 MED ORDER — DEXAMETHASONE SODIUM PHOSPHATE 10 MG/ML IJ SOLN
10.0000 mg | Freq: Once | INTRAMUSCULAR | Status: AC
  Administered 2011-10-01: 10 mg via INTRAVENOUS
  Filled 2011-10-01: qty 1

## 2011-10-01 MED ORDER — SODIUM CHLORIDE 0.9 % IV BOLUS (SEPSIS)
1000.0000 mL | Freq: Once | INTRAVENOUS | Status: AC
  Administered 2011-10-01: 1000 mL via INTRAVENOUS

## 2011-10-01 MED ORDER — METOCLOPRAMIDE HCL 5 MG/ML IJ SOLN
10.0000 mg | Freq: Once | INTRAMUSCULAR | Status: AC
  Administered 2011-10-01: 10 mg via INTRAVENOUS
  Filled 2011-10-01: qty 2

## 2011-10-01 MED ORDER — DIPHENHYDRAMINE HCL 50 MG/ML IJ SOLN
25.0000 mg | Freq: Once | INTRAMUSCULAR | Status: AC
  Administered 2011-10-01: 25 mg via INTRAVENOUS
  Filled 2011-10-01: qty 1
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
## 2011-10-01 NOTE — ED Notes (Signed)
MD Hyman Hopes aware lumbar setup is ready at bedside
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
## 2011-10-01 NOTE — ED Notes (Signed)
MD Hyman Hopes approved pt.'s c-collar to be removed. RN Victorino Dike notified.

## 2011-10-02 LAB — CSF CELL COUNT WITH DIFFERENTIAL
RBC Count, CSF: 3 /mm3 — ABNORMAL HIGH
RBC Count, CSF: 3 /mm3 — ABNORMAL HIGH
Tube #: 4
WBC, CSF: 2 /mm3 (ref 0–5)
WBC, CSF: 3 /mm3 (ref 0–5)

## 2011-10-02 MED ORDER — OXYCODONE-ACETAMINOPHEN 5-325 MG PO TABS: 2.0000 | ORAL_TABLET | ORAL | Status: AC | PRN

## 2011-10-06 ENCOUNTER — Telehealth: Payer: Self-pay | Admitting: *Deleted
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
## 2011-10-10 ENCOUNTER — Other Ambulatory Visit: Payer: Self-pay | Admitting: Internal Medicine

## 2011-10-11 NOTE — Telephone Encounter (Signed)
Completed nurse opened another phone note

## 2011-10-11 NOTE — Telephone Encounter (Signed)
Phone call completed ?

## 2011-10-19 ENCOUNTER — Telehealth: Payer: Self-pay | Admitting: *Deleted

## 2011-10-19 MED ORDER — ONDANSETRON 8 MG PO TBDP: 8.0000 mg | ORAL_TABLET | Freq: Three times a day (TID) | ORAL | Status: DC | PRN

## 2011-10-19 NOTE — Telephone Encounter (Signed)
Ordered med; will inform pt when she calls back. Pt has a [employer] phone and it will not accept incoming calls per pt.

## 2011-10-19 NOTE — Telephone Encounter (Signed)
Pt called today for a refill on Zofran. Update on headache post LP: pt went to [facility] ER and was given a "headache cocktail" and Vicodin. She did not take the Vicodin because she did not want to get hooked on it; instead she took Percocet and it worked fine. Pt reports she is using more Zofran and will be out by Monday. I asked about her f/u at [facility] and that is in October. Can pt have 8mg  Zofran instead of 4mg  and refills? Thanks.

## 2011-10-19 NOTE — Telephone Encounter (Signed)
Okay for zofran 8 mg q8h prn nausea

## 2011-10-21 ENCOUNTER — Telehealth: Payer: Self-pay | Admitting: *Deleted

## 2011-10-21 DIAGNOSIS — R110 Nausea: Secondary | ICD-10-CM

## 2011-10-21 MED ORDER — ONDANSETRON 8 MG PO TBDP: 8.0000 mg | ORAL_TABLET | Freq: Three times a day (TID) | ORAL | Status: AC | PRN

## 2011-10-21 NOTE — Telephone Encounter (Signed)
Sent Zofran to [facility] on [address]

## 2011-10-31 ENCOUNTER — Other Ambulatory Visit: Payer: Self-pay | Admitting: Internal Medicine

## 2011-10-31 ENCOUNTER — Other Ambulatory Visit: Payer: Self-pay | Admitting: Gastroenterology

## 2011-10-31 MED ORDER — ONDANSETRON 4 MG PO TBDP: 4.0000 mg | ORAL_TABLET | Freq: Three times a day (TID) | ORAL | Status: AC | PRN

## 2011-10-31 NOTE — Telephone Encounter (Signed)
Sent in Zofran for pt. Pharmacy to call when Rx is ready.

## 2011-11-01 ENCOUNTER — Encounter (HOSPITAL_COMMUNITY): Payer: Self-pay | Admitting: Emergency Medicine

## 2011-11-01 ENCOUNTER — Emergency Department (HOSPITAL_COMMUNITY)
Admission: EM | Admit: 2011-11-01 | Discharge: 2011-11-01 | Disposition: A | Discharge status: Home / Self Care | Payer: Medicaid Other | Attending: Emergency Medicine | Admitting: Emergency Medicine

## 2011-11-01 DIAGNOSIS — F4310 Post-traumatic stress disorder, unspecified: Secondary | ICD-10-CM | POA: Insufficient documentation

## 2011-11-01 DIAGNOSIS — D649 Anemia, unspecified: Secondary | ICD-10-CM | POA: Insufficient documentation

## 2011-11-01 DIAGNOSIS — M549 Dorsalgia, unspecified: Secondary | ICD-10-CM

## 2011-11-01 DIAGNOSIS — M129 Arthropathy, unspecified: Secondary | ICD-10-CM | POA: Insufficient documentation

## 2011-11-01 DIAGNOSIS — I10 Essential (primary) hypertension: Secondary | ICD-10-CM | POA: Insufficient documentation

## 2011-11-01 DIAGNOSIS — F17200 Nicotine dependence, unspecified, uncomplicated: Secondary | ICD-10-CM | POA: Insufficient documentation

## 2011-11-01 HISTORY — DX: Concussion with loss of consciousness status unknown, initial encounter: S06.0XAA

## 2011-11-01 HISTORY — DX: Concussion with loss of consciousness of unspecified duration, initial encounter: S06.0X9A

## 2011-11-01 MED ORDER — TRAMADOL HCL 50 MG PO TABS: 50.0000 mg | ORAL_TABLET | Freq: Four times a day (QID) | ORAL | Status: AC | PRN

## 2011-11-01 MED ORDER — ONDANSETRON 4 MG PO TBDP
4.0000 mg | ORAL_TABLET | Freq: Once | ORAL | Status: AC
  Administered 2011-11-01: 4 mg via ORAL
  Filled 2011-11-01: qty 1

## 2011-11-01 MED ORDER — MORPHINE SULFATE 4 MG/ML IJ SOLN
4.0000 mg | Freq: Once | INTRAMUSCULAR | Status: AC
  Administered 2011-11-01: 4 mg via INTRAMUSCULAR
  Filled 2011-11-01: qty 1

## 2011-11-01 MED ORDER — CYCLOBENZAPRINE HCL 5 MG PO TABS: 5.0000 mg | ORAL_TABLET | Freq: Three times a day (TID) | ORAL | Status: AC | PRN

## 2011-11-01 MED ORDER — HYDROCODONE-ACETAMINOPHEN 5-500 MG PO TABS: 1.0000 | ORAL_TABLET | Freq: Four times a day (QID) | ORAL | Status: AC | PRN

## 2011-11-01 MED ORDER — CYCLOBENZAPRINE HCL 5 MG PO TABS: 5.0000 mg | ORAL_TABLET | Freq: Two times a day (BID) | ORAL | Status: AC | PRN
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
## 2011-11-01 NOTE — ED Notes (Signed)
Had LP here for headaches 4 weeks ago--states still having pain at puncture site

## 2011-11-01 NOTE — ED Notes (Signed)
Pt reports having LP 4 weeks ago, states she did not experience pain until 2 days after puncture. States lower and upper spine burn, mid spine does not hurt. Reports "popping" of back. Worse when laying down. Prescription percocet has not been effective.

## 2011-11-03 NOTE — Telephone Encounter (Signed)
See previous message

## 2011-11-04 NOTE — ED Provider Notes (Signed)
Medical screening examination/treatment/procedure(s) were conducted as a shared visit with non-physician practitioner(s) and myself.  I personally evaluated the patient during the encounter Pt notes lp 4 weeks ago. Now c/o mid to lower back pain, not localized to prior lp site. Worse w bending, turning, palp area. Spine nt. No skin changes or erythema. No neck pain or stiffness. No headache. No fever/chills.   Suzi Roots, MD 11/04/11 [phone]

## 2011-11-23 ENCOUNTER — Encounter: Payer: Self-pay | Admitting: Internal Medicine

## 2011-11-25 ENCOUNTER — Ambulatory Visit (INDEPENDENT_AMBULATORY_CARE_PROVIDER_SITE_OTHER): Payer: Medicaid Other | Admitting: Internal Medicine

## 2011-11-25 ENCOUNTER — Encounter: Payer: Self-pay | Admitting: Internal Medicine

## 2011-11-25 VITALS — BP 130/80 | HR 70 | Ht 62.5 in | Wt 193.2 lb

## 2011-11-25 DIAGNOSIS — R110 Nausea: Secondary | ICD-10-CM

## 2011-11-25 MED ORDER — ONDANSETRON 8 MG PO TBDP: 8.0000 mg | ORAL_TABLET | Freq: Three times a day (TID) | ORAL | Status: DC | PRN

## 2011-11-25 NOTE — Patient Instructions (Addendum)
You have been scheduled for a gastric emptying scan at [facility] on 12-07-11 at 10:00am. Please arrive at least 15 minutes prior to your appointment for registration. Please make certain not to have anything to eat or drink at least 8 hours prior to your test. Hold all stomach medications (ex: Zofran, phenergan, Reglan) 48 hours prior to your test. If you need to reschedule your appointment, please contact radiology scheduling at [phone].     You may continue taking Zofran as needed for nausea
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
## 2011-12-07 ENCOUNTER — Encounter (HOSPITAL_COMMUNITY)
Admission: RE | Admit: 2011-12-07 | Discharge: 2011-12-07 | Disposition: A | Discharge status: Home / Self Care | Payer: Medicaid Other | Source: Ambulatory Visit | Attending: Internal Medicine | Admitting: Internal Medicine

## 2011-12-07 ENCOUNTER — Encounter (HOSPITAL_COMMUNITY): Payer: Self-pay

## 2011-12-07 DIAGNOSIS — R110 Nausea: Secondary | ICD-10-CM | POA: Insufficient documentation

## 2011-12-07 MED ORDER — TECHNETIUM TC 99M SULFUR COLLOID
2.0000 | Freq: Once | INTRAVENOUS | Status: AC | PRN
  Administered 2011-12-07: 2 via INTRAVENOUS

## 2011-12-12 ENCOUNTER — Telehealth: Payer: Self-pay | Admitting: Gastroenterology

## 2011-12-12 ENCOUNTER — Telehealth: Payer: Self-pay | Admitting: Internal Medicine

## 2011-12-12 NOTE — Telephone Encounter (Signed)
Spoke to pt. Told her per Dr. Rhea Belton:  Normal gastric emptying Given this result, He thinks the most likely etiology to her nausea is central, especially given that it started after her head injury/concussion Continue symptomatic treatment, OV prn. Pt stated understanding.

## 2011-12-12 NOTE — Telephone Encounter (Signed)
See previous note

## 2011-12-19 ENCOUNTER — Other Ambulatory Visit: Payer: Self-pay | Admitting: Internal Medicine

## 2012-01-02 ENCOUNTER — Other Ambulatory Visit: Payer: Self-pay | Admitting: Internal Medicine

## 2012-01-09 ENCOUNTER — Telehealth: Payer: Self-pay | Admitting: Internal Medicine

## 2012-01-09 DIAGNOSIS — R112 Nausea with vomiting, unspecified: Secondary | ICD-10-CM

## 2012-01-09 MED ORDER — ONDANSETRON 8 MG PO TBDP: 8.0000 mg | ORAL_TABLET | Freq: Three times a day (TID) | ORAL | Status: DC

## 2012-01-09 NOTE — Telephone Encounter (Signed)
Per Dr. Rhea Belton sent in Zofran 8 mg TID #120, pharmacy to call pt when prescription is ready.

## 2012-01-10 ENCOUNTER — Telehealth: Payer: Self-pay | Admitting: Internal Medicine

## 2012-01-10 NOTE — Telephone Encounter (Signed)
lmom for pt to call back if needed; informed her her Zofran was ordered yesterday.

## 2012-01-11 NOTE — Telephone Encounter (Signed)
PT DID NOT CALL BACK; ASSUMED SHE RECEIVED MY MESSAGE ABOUT THE ZOFRAN.

## 2012-01-15 ENCOUNTER — Encounter (HOSPITAL_COMMUNITY): Payer: Self-pay | Admitting: *Deleted

## 2012-01-15 ENCOUNTER — Emergency Department (HOSPITAL_COMMUNITY)
Admission: EM | Admit: 2012-01-15 | Discharge: 2012-01-16 | Disposition: A | Discharge status: Home / Self Care | Payer: Medicaid Other | Attending: Emergency Medicine | Admitting: Emergency Medicine

## 2012-01-15 DIAGNOSIS — R110 Nausea: Secondary | ICD-10-CM | POA: Insufficient documentation

## 2012-01-15 DIAGNOSIS — F4310 Post-traumatic stress disorder, unspecified: Secondary | ICD-10-CM | POA: Insufficient documentation

## 2012-01-15 DIAGNOSIS — F329 Major depressive disorder, single episode, unspecified: Secondary | ICD-10-CM | POA: Insufficient documentation

## 2012-01-15 DIAGNOSIS — Z862 Personal history of diseases of the blood and blood-forming organs and certain disorders involving the immune mechanism: Secondary | ICD-10-CM | POA: Insufficient documentation

## 2012-01-15 DIAGNOSIS — F411 Generalized anxiety disorder: Secondary | ICD-10-CM | POA: Insufficient documentation

## 2012-01-15 DIAGNOSIS — K5730 Diverticulosis of large intestine without perforation or abscess without bleeding: Secondary | ICD-10-CM | POA: Insufficient documentation

## 2012-01-15 DIAGNOSIS — M129 Arthropathy, unspecified: Secondary | ICD-10-CM | POA: Insufficient documentation

## 2012-01-15 DIAGNOSIS — Z87828 Personal history of other (healed) physical injury and trauma: Secondary | ICD-10-CM | POA: Insufficient documentation

## 2012-01-15 DIAGNOSIS — R51 Headache: Secondary | ICD-10-CM

## 2012-01-15 DIAGNOSIS — F3289 Other specified depressive episodes: Secondary | ICD-10-CM | POA: Insufficient documentation

## 2012-01-15 DIAGNOSIS — F17200 Nicotine dependence, unspecified, uncomplicated: Secondary | ICD-10-CM | POA: Insufficient documentation

## 2012-01-15 DIAGNOSIS — Z9089 Acquired absence of other organs: Secondary | ICD-10-CM | POA: Insufficient documentation

## 2012-01-15 DIAGNOSIS — G43909 Migraine, unspecified, not intractable, without status migrainosus: Secondary | ICD-10-CM | POA: Insufficient documentation

## 2012-01-15 DIAGNOSIS — I10 Essential (primary) hypertension: Secondary | ICD-10-CM | POA: Insufficient documentation

## 2012-01-15 DIAGNOSIS — Z9889 Other specified postprocedural states: Secondary | ICD-10-CM | POA: Insufficient documentation

## 2012-01-15 DIAGNOSIS — Z79899 Other long term (current) drug therapy: Secondary | ICD-10-CM | POA: Insufficient documentation

## 2012-01-15 MED ORDER — DIPHENHYDRAMINE HCL 50 MG/ML IJ SOLN
12.5000 mg | Freq: Once | INTRAMUSCULAR | Status: AC
  Administered 2012-01-15: 12.5 mg via INTRAVENOUS
  Filled 2012-01-15: qty 1

## 2012-01-15 MED ORDER — HYDROMORPHONE HCL PF 1 MG/ML IJ SOLN
1.0000 mg | Freq: Once | INTRAMUSCULAR | Status: AC
  Administered 2012-01-15: 1 mg via INTRAVENOUS
  Filled 2012-01-15: qty 1

## 2012-01-15 MED ORDER — METOCLOPRAMIDE HCL 5 MG/ML IJ SOLN
10.0000 mg | Freq: Once | INTRAMUSCULAR | Status: AC
  Administered 2012-01-15: 10 mg via INTRAVENOUS
  Filled 2012-01-15: qty 2
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
## 2012-01-16 NOTE — ED Notes (Signed)
Patient is alert and oriented x3.  She was given DC instructions and follow up visit instructions.  Patient gave verbal understanding. She was DC ambulatory under his own power to home.  V/S stable.  He was not showing any signs of distress on DC 

## 2012-01-16 NOTE — ED Notes (Signed)
Patient ready for DC but is not appropriate.   Patient notified of delay due to lethargic behavior from medications

## 2012-03-12 ENCOUNTER — Telehealth: Payer: Self-pay | Admitting: Internal Medicine

## 2012-03-12 MED ORDER — ONDANSETRON 8 MG PO TBDP: 8.0000 mg | ORAL_TABLET | Freq: Three times a day (TID) | ORAL | Status: DC | PRN

## 2012-03-12 NOTE — Telephone Encounter (Signed)
Pt rx has been sent

## 2012-03-22 ENCOUNTER — Emergency Department (HOSPITAL_COMMUNITY)
Admission: EM | Admit: 2012-03-22 | Discharge: 2012-03-23 | Disposition: A | Discharge status: Home / Self Care | Payer: Medicaid Other | Attending: Emergency Medicine | Admitting: Emergency Medicine

## 2012-03-22 DIAGNOSIS — Z860100 Personal history of colon polyps, unspecified: Secondary | ICD-10-CM | POA: Insufficient documentation

## 2012-03-22 DIAGNOSIS — R0789 Other chest pain: Secondary | ICD-10-CM | POA: Insufficient documentation

## 2012-03-22 DIAGNOSIS — Z87828 Personal history of other (healed) physical injury and trauma: Secondary | ICD-10-CM | POA: Insufficient documentation

## 2012-03-22 DIAGNOSIS — M129 Arthropathy, unspecified: Secondary | ICD-10-CM | POA: Insufficient documentation

## 2012-03-22 DIAGNOSIS — Z79899 Other long term (current) drug therapy: Secondary | ICD-10-CM | POA: Insufficient documentation

## 2012-03-22 DIAGNOSIS — G43909 Migraine, unspecified, not intractable, without status migrainosus: Secondary | ICD-10-CM | POA: Insufficient documentation

## 2012-03-22 DIAGNOSIS — Z8679 Personal history of other diseases of the circulatory system: Secondary | ICD-10-CM | POA: Insufficient documentation

## 2012-03-22 DIAGNOSIS — F419 Anxiety disorder, unspecified: Secondary | ICD-10-CM

## 2012-03-22 DIAGNOSIS — Z8719 Personal history of other diseases of the digestive system: Secondary | ICD-10-CM | POA: Insufficient documentation

## 2012-03-22 DIAGNOSIS — F329 Major depressive disorder, single episode, unspecified: Secondary | ICD-10-CM | POA: Insufficient documentation

## 2012-03-22 DIAGNOSIS — F411 Generalized anxiety disorder: Secondary | ICD-10-CM | POA: Insufficient documentation

## 2012-03-22 DIAGNOSIS — I10 Essential (primary) hypertension: Secondary | ICD-10-CM | POA: Insufficient documentation

## 2012-03-22 DIAGNOSIS — F3289 Other specified depressive episodes: Secondary | ICD-10-CM | POA: Insufficient documentation

## 2012-03-22 DIAGNOSIS — F17200 Nicotine dependence, unspecified, uncomplicated: Secondary | ICD-10-CM | POA: Insufficient documentation

## 2012-03-22 DIAGNOSIS — Z862 Personal history of diseases of the blood and blood-forming organs and certain disorders involving the immune mechanism: Secondary | ICD-10-CM | POA: Insufficient documentation

## 2012-03-22 NOTE — ED Notes (Signed)
Per EMS, patient c/o anxiety, chest wall pain. Patient states symptoms have been going on for 2 weeks. Patient stated she took her Ativan about 1 hour ago with no relief. Patient has bottle of Ativan with her.

## 2012-03-23 ENCOUNTER — Encounter (HOSPITAL_COMMUNITY): Payer: Self-pay | Admitting: Emergency Medicine

## 2012-03-23 LAB — CBC WITH DIFFERENTIAL/PLATELET
Basophils Absolute: 0.1 10*3/uL (ref 0.0–0.1)
Eosinophils Absolute: 0.1 10*3/uL (ref 0.0–0.7)
Eosinophils Relative: 2 % (ref 0–5)
Lymphs Abs: 2.3 10*3/uL (ref 0.7–4.0)
MCH: 29.7 pg (ref 26.0–34.0)
MCV: 90.7 fL (ref 78.0–100.0)
Neutrophils Relative %: 41 % — ABNORMAL LOW (ref 43–77)
Platelets: 250 10*3/uL (ref 150–400)
RBC: 3.43 MIL/uL — ABNORMAL LOW (ref 3.87–5.11)
RDW: 14.5 % (ref 11.5–15.5)
WBC: 5 10*3/uL (ref 4.0–10.5)

## 2012-03-23 LAB — URINE MICROSCOPIC-ADD ON

## 2012-03-23 LAB — POCT I-STAT, CHEM 8
BUN: 17 mg/dL (ref 6–23)
Chloride: 111 mEq/L (ref 96–112)
Creatinine, Ser: 0.9 mg/dL (ref 0.50–1.10)
Glucose, Bld: 87 mg/dL (ref 70–99)
Hemoglobin: 10.5 g/dL — ABNORMAL LOW (ref 12.0–15.0)
Potassium: 4.2 mEq/L (ref 3.5–5.1)
Sodium: 141 mEq/L (ref 135–145)

## 2012-03-23 LAB — URINALYSIS, ROUTINE W REFLEX MICROSCOPIC
Ketones, ur: NEGATIVE mg/dL
Leukocytes, UA: NEGATIVE
Nitrite: NEGATIVE
Protein, ur: NEGATIVE mg/dL
Urobilinogen, UA: 0.2 mg/dL (ref 0.0–1.0)

## 2012-03-23 LAB — POCT I-STAT TROPONIN I: Troponin i, poc: 0 ng/mL (ref 0.00–0.08)

## 2012-03-23 LAB — RAPID URINE DRUG SCREEN, HOSP PERFORMED
Amphetamines: NOT DETECTED
Opiates: NOT DETECTED

## 2012-03-23 NOTE — ED Notes (Signed)
Patient states for last 2 weeks she has had increasing anxiety. Reports generalized pain 7/10, pain sites include head, left arm, back, left shoulder. Patient states pain in her arm is a squeezing type pain, and occasionally shoots up and down her arm. Patient states today she felt very weak, like she was going to pass out. Patient states "I am very tired, just extremely tired, and it just felt like someone was just squeezing my heart. Patient NSR on cardiac monitor. VSS at this time. Patient states she takes 1mg  Ativan, up to 2 a day. Patient states she has been having panic attacks 3-4 times per day last week, this week has been a little better. Patient called her prescribing physician and they were unavailable til Monday. Patient states she could not wait that long.

## 2012-03-23 NOTE — ED Provider Notes (Signed)
Medical screening examination/treatment/procedure(s) were performed by non-physician practitioner and as supervising physician I was immediately available for consultation/collaboration.   Dartanyon Frankowski L Zannie Runkle, MD 03/23/12 0709 
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
## 2012-04-27 ENCOUNTER — Other Ambulatory Visit: Payer: Self-pay | Admitting: Sports Medicine

## 2012-04-27 DIAGNOSIS — M542 Cervicalgia: Secondary | ICD-10-CM

## 2012-04-27 DIAGNOSIS — M549 Dorsalgia, unspecified: Secondary | ICD-10-CM

## 2012-05-15 ENCOUNTER — Other Ambulatory Visit: Payer: Medicaid Other

## 2012-05-23 ENCOUNTER — Other Ambulatory Visit: Payer: Self-pay | Admitting: Internal Medicine

## 2012-06-12 ENCOUNTER — Other Ambulatory Visit: Payer: Self-pay | Admitting: Internal Medicine

## 2012-06-17 ENCOUNTER — Other Ambulatory Visit: Payer: Medicaid Other

## 2012-06-21 ENCOUNTER — Ambulatory Visit
Admission: RE | Admit: 2012-06-21 | Discharge: 2012-06-21 | Disposition: A | Discharge status: Home / Self Care | Payer: Medicaid Other | Source: Ambulatory Visit | Attending: Sports Medicine | Admitting: Sports Medicine

## 2012-06-21 DIAGNOSIS — M542 Cervicalgia: Secondary | ICD-10-CM

## 2012-06-21 DIAGNOSIS — M549 Dorsalgia, unspecified: Secondary | ICD-10-CM

## 2012-07-31 ENCOUNTER — Encounter (HOSPITAL_COMMUNITY): Payer: Self-pay | Admitting: *Deleted

## 2012-07-31 ENCOUNTER — Emergency Department (HOSPITAL_COMMUNITY)
Admission: EM | Admit: 2012-07-31 | Discharge: 2012-07-31 | Disposition: A | Discharge status: Home / Self Care | Payer: Medicaid Other | Attending: Emergency Medicine | Admitting: Emergency Medicine

## 2012-07-31 DIAGNOSIS — F4310 Post-traumatic stress disorder, unspecified: Secondary | ICD-10-CM | POA: Insufficient documentation

## 2012-07-31 DIAGNOSIS — F17200 Nicotine dependence, unspecified, uncomplicated: Secondary | ICD-10-CM | POA: Insufficient documentation

## 2012-07-31 DIAGNOSIS — F489 Nonpsychotic mental disorder, unspecified: Secondary | ICD-10-CM | POA: Insufficient documentation

## 2012-07-31 DIAGNOSIS — Z79899 Other long term (current) drug therapy: Secondary | ICD-10-CM | POA: Insufficient documentation

## 2012-07-31 DIAGNOSIS — R110 Nausea: Secondary | ICD-10-CM

## 2012-07-31 DIAGNOSIS — Y939 Activity, unspecified: Secondary | ICD-10-CM | POA: Insufficient documentation

## 2012-07-31 DIAGNOSIS — S0993XA Unspecified injury of face, initial encounter: Secondary | ICD-10-CM | POA: Insufficient documentation

## 2012-07-31 DIAGNOSIS — G43909 Migraine, unspecified, not intractable, without status migrainosus: Secondary | ICD-10-CM | POA: Insufficient documentation

## 2012-07-31 DIAGNOSIS — F329 Major depressive disorder, single episode, unspecified: Secondary | ICD-10-CM | POA: Insufficient documentation

## 2012-07-31 DIAGNOSIS — Y929 Unspecified place or not applicable: Secondary | ICD-10-CM | POA: Insufficient documentation

## 2012-07-31 DIAGNOSIS — S199XXA Unspecified injury of neck, initial encounter: Secondary | ICD-10-CM | POA: Insufficient documentation

## 2012-07-31 DIAGNOSIS — G8929 Other chronic pain: Secondary | ICD-10-CM

## 2012-07-31 DIAGNOSIS — I10 Essential (primary) hypertension: Secondary | ICD-10-CM | POA: Insufficient documentation

## 2012-07-31 DIAGNOSIS — M1990 Unspecified osteoarthritis, unspecified site: Secondary | ICD-10-CM | POA: Insufficient documentation

## 2012-07-31 DIAGNOSIS — F411 Generalized anxiety disorder: Secondary | ICD-10-CM | POA: Insufficient documentation

## 2012-07-31 DIAGNOSIS — M549 Dorsalgia, unspecified: Secondary | ICD-10-CM | POA: Insufficient documentation

## 2012-07-31 DIAGNOSIS — Z87820 Personal history of traumatic brain injury: Secondary | ICD-10-CM | POA: Insufficient documentation

## 2012-07-31 DIAGNOSIS — Z8719 Personal history of other diseases of the digestive system: Secondary | ICD-10-CM | POA: Insufficient documentation

## 2012-07-31 DIAGNOSIS — Z862 Personal history of diseases of the blood and blood-forming organs and certain disorders involving the immune mechanism: Secondary | ICD-10-CM | POA: Insufficient documentation

## 2012-07-31 DIAGNOSIS — Z8679 Personal history of other diseases of the circulatory system: Secondary | ICD-10-CM | POA: Insufficient documentation

## 2012-07-31 DIAGNOSIS — F3289 Other specified depressive episodes: Secondary | ICD-10-CM | POA: Insufficient documentation

## 2012-07-31 MED ORDER — HYDROCODONE-ACETAMINOPHEN 5-325 MG PO TABS
1.0000 | ORAL_TABLET | Freq: Once | ORAL | Status: AC
  Administered 2012-07-31: 1 via ORAL
  Filled 2012-07-31: qty 1

## 2012-07-31 MED ORDER — ONDANSETRON HCL 8 MG PO TABS: 8.0000 mg | ORAL_TABLET | Freq: Three times a day (TID) | ORAL | Status: DC | PRN

## 2012-07-31 MED ORDER — ONDANSETRON 4 MG PO TBDP
4.0000 mg | ORAL_TABLET | Freq: Once | ORAL | Status: AC
  Administered 2012-07-31: 4 mg via ORAL
  Filled 2012-07-31: qty 1

## 2012-07-31 MED ORDER — ONDANSETRON 8 MG PO TBDP: 8.0000 mg | ORAL_TABLET | Freq: Three times a day (TID) | ORAL | Status: DC | PRN
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
## 2012-10-30 ENCOUNTER — Telehealth: Payer: Self-pay | Admitting: Internal Medicine

## 2012-10-30 NOTE — Telephone Encounter (Signed)
Pt with a long hx of nausea also seen at [facility]. For a long time, zofran has worked for her, but she states now it doesn't; she has taken 3 pills today, 8mg  I presume, and the nausea is still present. She was seen at [facility] last month and they want her to see a GI doc there, but her appt isn't until October. I asked if she was able to eat and she stated yes. Please advise. Thanks.

## 2012-10-31 NOTE — Telephone Encounter (Signed)
Pt states she is taking the Zofran RTC; states she's stressed maybe that's the reason she's nauseated. She will continue to obtain an earlier appt at [facility].

## 2012-10-31 NOTE — Telephone Encounter (Signed)
Would ensure zofran is 8 mg on scheduled basis every 8 hours No other great options at this point, agree with GI opinion at [facility].  She could call and see if they can see her sooner than Oct.

## 2012-10-31 NOTE — Telephone Encounter (Signed)
lmom for pt to call back

## 2012-11-26 ENCOUNTER — Other Ambulatory Visit: Payer: Self-pay | Admitting: Internal Medicine

## 2012-12-10 ENCOUNTER — Other Ambulatory Visit: Payer: Self-pay | Admitting: Internal Medicine

## 2013-02-01 ENCOUNTER — Other Ambulatory Visit: Payer: Self-pay | Admitting: Internal Medicine

## 2013-02-06 ENCOUNTER — Telehealth: Payer: Self-pay | Admitting: Internal Medicine

## 2013-02-06 NOTE — Telephone Encounter (Signed)
Pt with a hx of nausea, anxiety, depression, PTSD, generalized headaches, and anal fissure. Last seen for nausea 11/25/11 and a GES was done that was normal. Pt seen at [facility]. lmom for pt to call back.

## 2013-02-06 NOTE — Telephone Encounter (Signed)
Pt called back and states she is so stressed; she is having headaches and her Zofran is no longer working. She reports she has no appetite and has to force herself to eat. States she lost weight; at least 5 lbs. The transportation service messed up her appt with her neurologist at [facility], now she will wait a total of 5 months to see the neurologist. I asked her what she wanted [place] to do an she stated she doesn't know, her counselor told her to call us. She has tried Zofran and Phenergan; I asked if she has tried Compazine and she doesn't think so. Dr Rhea Belton, can we try Compazine or something else? Thanks.

## 2013-02-06 NOTE — Telephone Encounter (Signed)
Okay for trial of Compazine 5 mg every 6 hours when necessary nausea. I do not want her exceeding recommended dose No Zofran or Phenergan while using Compazine

## 2013-02-07 NOTE — Telephone Encounter (Signed)
lmom for pt to call back

## 2013-02-08 MED ORDER — PROCHLORPERAZINE MALEATE 5 MG PO TABS: 5.0000 mg | ORAL_TABLET | Freq: Four times a day (QID) | ORAL | Status: DC | PRN

## 2013-02-08 NOTE — Telephone Encounter (Signed)
Left message for patient to call back rx sent for compazine

## 2013-02-13 NOTE — Telephone Encounter (Signed)
Pt never called back.

## 2013-03-07 ENCOUNTER — Ambulatory Visit: Payer: Self-pay | Admitting: Obstetrics

## 2013-07-23 ENCOUNTER — Emergency Department (HOSPITAL_COMMUNITY)
Admission: EM | Admit: 2013-07-23 | Discharge: 2013-07-23 | Disposition: A | Discharge status: Home / Self Care | Payer: Medicare Other | Attending: Emergency Medicine | Admitting: Emergency Medicine

## 2013-07-23 ENCOUNTER — Encounter (HOSPITAL_COMMUNITY): Payer: Self-pay | Admitting: Emergency Medicine

## 2013-07-23 DIAGNOSIS — F17200 Nicotine dependence, unspecified, uncomplicated: Secondary | ICD-10-CM | POA: Diagnosis not present

## 2013-07-23 DIAGNOSIS — M129 Arthropathy, unspecified: Secondary | ICD-10-CM | POA: Insufficient documentation

## 2013-07-23 DIAGNOSIS — Z860100 Personal history of colon polyps, unspecified: Secondary | ICD-10-CM | POA: Insufficient documentation

## 2013-07-23 DIAGNOSIS — R42 Dizziness and giddiness: Secondary | ICD-10-CM | POA: Diagnosis present

## 2013-07-23 DIAGNOSIS — G43909 Migraine, unspecified, not intractable, without status migrainosus: Secondary | ICD-10-CM | POA: Diagnosis not present

## 2013-07-23 DIAGNOSIS — F329 Major depressive disorder, single episode, unspecified: Secondary | ICD-10-CM | POA: Diagnosis not present

## 2013-07-23 DIAGNOSIS — F411 Generalized anxiety disorder: Secondary | ICD-10-CM | POA: Diagnosis not present

## 2013-07-23 DIAGNOSIS — I10 Essential (primary) hypertension: Secondary | ICD-10-CM | POA: Insufficient documentation

## 2013-07-23 DIAGNOSIS — Z87828 Personal history of other (healed) physical injury and trauma: Secondary | ICD-10-CM | POA: Insufficient documentation

## 2013-07-23 DIAGNOSIS — Z862 Personal history of diseases of the blood and blood-forming organs and certain disorders involving the immune mechanism: Secondary | ICD-10-CM | POA: Diagnosis not present

## 2013-07-23 DIAGNOSIS — G8929 Other chronic pain: Secondary | ICD-10-CM | POA: Diagnosis not present

## 2013-07-23 DIAGNOSIS — F419 Anxiety disorder, unspecified: Secondary | ICD-10-CM

## 2013-07-23 DIAGNOSIS — F3289 Other specified depressive episodes: Secondary | ICD-10-CM | POA: Insufficient documentation

## 2013-07-23 LAB — I-STAT CHEM 8, ED
BUN: 12 mg/dL (ref 6–23)
CALCIUM ION: 1.27 mmol/L — AB (ref 1.12–1.23)
CHLORIDE: 102 meq/L (ref 96–112)
CREATININE: 0.9 mg/dL (ref 0.50–1.10)
Glucose, Bld: 91 mg/dL (ref 70–99)
HCT: 38 % (ref 36.0–46.0)
Hemoglobin: 12.9 g/dL (ref 12.0–15.0)
Potassium: 4.2 mEq/L (ref 3.7–5.3)
Sodium: 139 mEq/L (ref 137–147)
TCO2: 25 mmol/L (ref 0–100)

## 2013-07-23 LAB — I-STAT TROPONIN, ED: TROPONIN I, POC: 0 ng/mL (ref 0.00–0.08)

## 2013-07-23 MED ORDER — LORAZEPAM 2 MG/ML IJ SOLN: 1.0000 mg | Freq: Once | INTRAMUSCULAR | Status: DC

## 2013-07-23 MED ORDER — ONDANSETRON 4 MG PO TBDP
4.0000 mg | ORAL_TABLET | Freq: Once | ORAL | Status: AC
  Administered 2013-07-23: 4 mg via ORAL
  Filled 2013-07-23: qty 1

## 2013-07-23 MED ORDER — ONDANSETRON HCL 4 MG PO TABS: 4.0000 mg | ORAL_TABLET | Freq: Four times a day (QID) | ORAL | Status: DC

## 2013-07-23 MED ORDER — LORAZEPAM 1 MG PO TABS: 1.0000 mg | ORAL_TABLET | Freq: Three times a day (TID) | ORAL | Status: DC | PRN

## 2013-07-23 MED ORDER — KETOROLAC TROMETHAMINE 30 MG/ML IJ SOLN: 30.0000 mg | Freq: Once | INTRAMUSCULAR | Status: DC

## 2013-07-23 MED ORDER — ONDANSETRON HCL 4 MG/2ML IJ SOLN: 4.0000 mg | Freq: Once | INTRAMUSCULAR | Status: DC

## 2013-07-23 MED ORDER — IBUPROFEN 800 MG PO TABS: 800.0000 mg | ORAL_TABLET | Freq: Three times a day (TID) | ORAL | Status: DC | PRN

## 2013-07-23 MED ORDER — IBUPROFEN 800 MG PO TABS
800.0000 mg | ORAL_TABLET | Freq: Once | ORAL | Status: AC
  Administered 2013-07-23: 800 mg via ORAL
  Filled 2013-07-23: qty 1

## 2013-07-23 MED ORDER — SODIUM CHLORIDE 0.9 % IV BOLUS (SEPSIS): 1000.0000 mL | Freq: Once | INTRAVENOUS | Status: DC

## 2013-07-23 MED ORDER — LORAZEPAM 1 MG PO TABS
1.0000 mg | ORAL_TABLET | Freq: Once | ORAL | Status: AC
  Administered 2013-07-23: 1 mg via ORAL
  Filled 2013-07-23: qty 1
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
## 2013-07-23 NOTE — Discharge Instructions (Signed)
Panic Attacks Panic attacks are sudden, short-livedsurges of severe anxiety, fear, or discomfort. They [place] occur for no reason when you are relaxed, when you are anxious, or when you are sleeping. Panic attacks [place] occur for a number of reasons:   Healthy people occasionally have panic attacks in extreme, life-threatening situations, such as war or natural disasters. Normal anxiety is a protective mechanism of the body that helps [place] react to danger (fight or flight response).  Panic attacks are often seen with anxiety disorders, such as panic disorder, social anxiety disorder, generalized anxiety disorder, and phobias. Anxiety disorders cause excessive or uncontrollable anxiety. They [place] interfere with your relationships or other life activities.  Panic attacks are sometimes seen with other mental illnesses such as depression and posttraumatic stress disorder.  Certain medical conditions, prescription medicines, and drugs of abuse can cause panic attacks. SYMPTOMS  Panic attacks start suddenly, peak within 20 minutes, and are accompanied by four or more of the following symptoms:  Pounding heart or fast heart rate (palpitations).  Sweating.  Trembling or shaking.  Shortness of breath or feeling smothered.  Feeling choked.  Chest pain or discomfort.  Nausea or strange feeling in your stomach.  Dizziness, lightheadedness, or feeling like you will faint.  Chills or hot flushes.  Numbness or tingling in your lips or hands and feet.  Feeling that things are not real or feeling that you are not yourself.  Fear of losing control or going crazy.  Fear of dying. Some of these symptoms can mimic serious medical conditions. For example, you [place] think you are having a heart attack. Although panic attacks can be very scary, they are not life threatening. DIAGNOSIS  Panic attacks are diagnosed through an assessment by your health care provider. Your health care provider will ask questions  about your symptoms, such as where and when they occurred. Your health care provider will also ask about your medical history and use of alcohol and drugs, including prescription medicines. Your health care provider [place] order blood tests or other studies to rule out a serious medical condition. Your health care provider [place]ard refer you to a mental health professional for further evaluation. TREATMENT   Most healthy people who have one or two panic attacks in an extreme, life-threatening situation will not require treatment.  The treatment for panic attacks associated with anxiety disorders or other mental illness typically involves counseling with a mental health professional, medicine, or a combination of both. Your health care provider will help determine what treatment is best for you.  Panic attacks due to physical illness usually goes away with treatment of the illness. If prescription medicine is causing panic attacks, talk with your health care provider about stopping the medicine, decreasing the dose, or substituting another medicine.  Panic attacks due to alcohol or drug abuse goes away with abstinence. Some adults need professional help in order to stop drinking or using drugs. HOME CARE INSTRUCTIONS   Take all your medicines as prescribed.   Check with your health care provider before starting new prescription or over-the-counter medicines.  Keep all follow up appointments with your health care provider. SEEK MEDICAL CARE IF:  You are not able to take your medicines as prescribed.  Your symptoms do not improve or get worse. SEEK IMMEDIATE MEDICAL CARE IF:   You experience panic attack symptoms that are different than your usual symptoms.  You have serious thoughts about hurting yourself or others.  You are taking medicine for panic attacks and  have a serious side effect. MAKE SURE YOU:  Understand these instructions.  Will watch your condition.  Will get help right away  if you are not doing well or get worse. Document Released: 03/07/2005 Document Revised: 12/26/2012 Document Reviewed: 10/19/2012 [facility] Patient Information 2014 [place].

## 2013-07-23 NOTE — ED Notes (Signed)
Patient refused medications by IV.  Dr. Leonides Schanz notified.  Medications ordered orally.
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
## 2013-09-20 ENCOUNTER — Encounter (HOSPITAL_COMMUNITY): Payer: Self-pay | Admitting: Emergency Medicine

## 2013-09-20 ENCOUNTER — Emergency Department (HOSPITAL_COMMUNITY)
Admission: EM | Admit: 2013-09-20 | Discharge: 2013-09-20 | Disposition: A | Discharge status: Home / Self Care | Payer: Medicare Other | Attending: Emergency Medicine | Admitting: Emergency Medicine

## 2013-09-20 ENCOUNTER — Emergency Department (HOSPITAL_COMMUNITY): Payer: Medicare Other

## 2013-09-20 DIAGNOSIS — I10 Essential (primary) hypertension: Secondary | ICD-10-CM | POA: Diagnosis not present

## 2013-09-20 DIAGNOSIS — M129 Arthropathy, unspecified: Secondary | ICD-10-CM | POA: Diagnosis not present

## 2013-09-20 DIAGNOSIS — G43709 Chronic migraine without aura, not intractable, without status migrainosus: Secondary | ICD-10-CM | POA: Diagnosis not present

## 2013-09-20 DIAGNOSIS — R059 Cough, unspecified: Secondary | ICD-10-CM | POA: Insufficient documentation

## 2013-09-20 DIAGNOSIS — F17200 Nicotine dependence, unspecified, uncomplicated: Secondary | ICD-10-CM | POA: Diagnosis not present

## 2013-09-20 DIAGNOSIS — Z862 Personal history of diseases of the blood and blood-forming organs and certain disorders involving the immune mechanism: Secondary | ICD-10-CM | POA: Diagnosis not present

## 2013-09-20 DIAGNOSIS — Z8659 Personal history of other mental and behavioral disorders: Secondary | ICD-10-CM | POA: Diagnosis not present

## 2013-09-20 DIAGNOSIS — R519 Headache, unspecified: Secondary | ICD-10-CM

## 2013-09-20 DIAGNOSIS — Z8719 Personal history of other diseases of the digestive system: Secondary | ICD-10-CM | POA: Insufficient documentation

## 2013-09-20 DIAGNOSIS — Z79899 Other long term (current) drug therapy: Secondary | ICD-10-CM | POA: Insufficient documentation

## 2013-09-20 DIAGNOSIS — R110 Nausea: Secondary | ICD-10-CM | POA: Diagnosis present

## 2013-09-20 DIAGNOSIS — Z87828 Personal history of other (healed) physical injury and trauma: Secondary | ICD-10-CM | POA: Diagnosis not present

## 2013-09-20 DIAGNOSIS — R51 Headache: Secondary | ICD-10-CM

## 2013-09-20 DIAGNOSIS — R05 Cough: Secondary | ICD-10-CM | POA: Diagnosis not present

## 2013-09-20 DIAGNOSIS — Z860100 Personal history of colon polyps, unspecified: Secondary | ICD-10-CM | POA: Insufficient documentation

## 2013-09-20 DIAGNOSIS — R319 Hematuria, unspecified: Secondary | ICD-10-CM

## 2013-09-20 DIAGNOSIS — Z3202 Encounter for pregnancy test, result negative: Secondary | ICD-10-CM | POA: Diagnosis not present

## 2013-09-20 LAB — CBC WITH DIFFERENTIAL/PLATELET
Basophils Absolute: 0.1 10*3/uL (ref 0.0–0.1)
Basophils Relative: 1 % (ref 0–1)
Eosinophils Absolute: 0.1 10*3/uL (ref 0.0–0.7)
Eosinophils Relative: 1 % (ref 0–5)
HCT: 34.9 % — ABNORMAL LOW (ref 36.0–46.0)
Hemoglobin: 11.5 g/dL — ABNORMAL LOW (ref 12.0–15.0)
Lymphocytes Relative: 31 % (ref 12–46)
Lymphs Abs: 2 10*3/uL (ref 0.7–4.0)
MCH: 29.2 pg (ref 26.0–34.0)
MCHC: 33 g/dL (ref 30.0–36.0)
MCV: 88.6 fL (ref 78.0–100.0)
Monocytes Absolute: 0.4 10*3/uL (ref 0.1–1.0)
Monocytes Relative: 7 % (ref 3–12)
Neutro Abs: 3.8 10*3/uL (ref 1.7–7.7)
Neutrophils Relative %: 60 % (ref 43–77)
Platelets: 379 10*3/uL (ref 150–400)
RBC: 3.94 MIL/uL (ref 3.87–5.11)
RDW: 15.7 % — ABNORMAL HIGH (ref 11.5–15.5)
WBC: 6.3 10*3/uL (ref 4.0–10.5)

## 2013-09-20 LAB — BASIC METABOLIC PANEL
Anion gap: 10 (ref 5–15)
BUN: 10 mg/dL (ref 6–23)
CHLORIDE: 102 meq/L (ref 96–112)
CO2: 28 mEq/L (ref 19–32)
Calcium: 9.6 mg/dL (ref 8.4–10.5)
Creatinine, Ser: 0.8 mg/dL (ref 0.50–1.10)
GFR calc non Af Amer: 87 mL/min — ABNORMAL LOW (ref >90)
GLUCOSE: 101 mg/dL — AB (ref 70–99)
Potassium: 4 mEq/L (ref 3.7–5.3)
Sodium: 140 mEq/L (ref 137–147)

## 2013-09-20 LAB — URINALYSIS, ROUTINE W REFLEX MICROSCOPIC
Bilirubin Urine: NEGATIVE
Glucose, UA: NEGATIVE mg/dL
Ketones, ur: NEGATIVE mg/dL
Leukocytes, UA: NEGATIVE
Nitrite: NEGATIVE
Protein, ur: NEGATIVE mg/dL
Specific Gravity, Urine: 1.022 (ref 1.005–1.030)
Urobilinogen, UA: 0.2 mg/dL (ref 0.0–1.0)
pH: 5.5 (ref 5.0–8.0)

## 2013-09-20 LAB — URINE MICROSCOPIC-ADD ON

## 2013-09-20 LAB — PREGNANCY, URINE: Preg Test, Ur: NEGATIVE

## 2013-09-20 MED ORDER — GUAIFENESIN-DM 100-10 MG/5ML PO SYRP: 5.0000 mL | ORAL_SOLUTION | ORAL | Status: DC | PRN

## 2013-09-20 MED ORDER — SODIUM CHLORIDE 0.9 % IV BOLUS (SEPSIS)
1000.0000 mL | Freq: Once | INTRAVENOUS | Status: AC
  Administered 2013-09-20: 1000 mL via INTRAVENOUS

## 2013-09-20 MED ORDER — METOCLOPRAMIDE HCL 5 MG/ML IJ SOLN
10.0000 mg | Freq: Once | INTRAMUSCULAR | Status: AC
  Administered 2013-09-20: 10 mg via INTRAVENOUS
  Filled 2013-09-20: qty 2

## 2013-09-20 MED ORDER — DIPHENHYDRAMINE HCL 50 MG/ML IJ SOLN
25.0000 mg | Freq: Once | INTRAMUSCULAR | Status: AC
  Administered 2013-09-20: 25 mg via INTRAVENOUS
  Filled 2013-09-20: qty 1
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
## 2013-09-20 NOTE — ED Notes (Signed)
Per pt, states her medication is not working for her nausea-states she is also dizzy for a possible cold

## 2013-09-20 NOTE — ED Notes (Signed)
Pt states she feels "weird" after getting IV medications. Pt states "I want this out of my arm". PA aware.

## 2013-09-20 NOTE — Discharge Instructions (Signed)
Continue to take your zofran as needed for nausea Drink plenty of fluids and rest  Try cough medicine as directed for cough  Your urine had a trace amount of blood in it - follow-up with your doctor for a repeat urine to ensure that this clears  Return to the emergency department if you develop any changing/worsening condition, fever, difficulty breathing, repeated vomiting, or any other concerns (please read additional information regarding your condition below)    Cough, Adult  A cough is a reflex that helps clear your throat and airways. It can help heal the body or may be a reaction to an irritated airway. A cough may only last 2 or 3 weeks (acute) or may last more than 8 weeks (chronic).  CAUSES Acute cough:  Viral or bacterial infections. Chronic cough:  Infections.  Allergies.  Asthma.  Post-nasal drip.  Smoking.  Heartburn or acid reflux.  Some medicines.  Chronic lung problems (COPD).  Cancer. SYMPTOMS   Cough.  Fever.  Chest pain.  Increased breathing rate.  High-pitched whistling sound when breathing (wheezing).  Colored mucus that you cough up (sputum). TREATMENT   A bacterial cough may be treated with antibiotic medicine.  A viral cough must run its course and will not respond to antibiotics.  Your caregiver may recommend other treatments if you have a chronic cough. HOME CARE INSTRUCTIONS   Only take over-the-counter or prescription medicines for pain, discomfort, or fever as directed by your caregiver. Use cough suppressants only as directed by your caregiver.  Use a cold steam vaporizer or humidifier in your bedroom or home to help loosen secretions.  Sleep in a semi-upright position if your cough is worse at night.  Rest as needed.  Stop smoking if you smoke. SEEK IMMEDIATE MEDICAL CARE IF:   You have pus in your sputum.  Your cough starts to worsen.  You cannot control your cough with suppressants and are losing sleep.  You  begin coughing up blood.  You have difficulty breathing.  You develop pain which is getting worse or is uncontrolled with medicine.  You have a fever. MAKE SURE YOU:   Understand these instructions.  Will watch your condition.  Will get help right away if you are not doing well or get worse. Document Released: 09/03/2010 Document Revised: 05/30/2011 Document Reviewed: 09/03/2010 [facility] Patient Information 2015 [place], [place]. This information is not intended to replace advice given to you by your health care provider. Make sure you discuss any questions you have with your health care provider.  Nausea and Vomiting Nausea is a sick feeling that often comes before throwing up (vomiting). Vomiting is a reflex where stomach contents come out of your mouth. Vomiting can cause severe loss of body fluids (dehydration). Children and elderly adults can become dehydrated quickly, especially if they also have diarrhea. Nausea and vomiting are symptoms of a condition or disease. It is important to find the cause of your symptoms. CAUSES   Direct irritation of the stomach lining. This irritation can result from increased acid production (gastroesophageal reflux disease), infection, food poisoning, taking certain medicines (such as nonsteroidal anti-inflammatory drugs), alcohol use, or tobacco use.  Signals from the brain.These signals could be caused by a headache, heat exposure, an inner ear disturbance, increased pressure in the brain from injury, infection, a tumor, or a concussion, pain, emotional stimulus, or metabolic problems.  An obstruction in the gastrointestinal tract (bowel obstruction).  Illnesses such as diabetes, hepatitis, gallbladder problems, appendicitis, kidney problems, cancer,  sepsis, atypical symptoms of a heart attack, or eating disorders.  Medical treatments such as chemotherapy and radiation.  Receiving medicine that makes you sleep (general anesthetic) during  surgery. DIAGNOSIS Your caregiver may ask for tests to be done if the problems do not improve after a few days. Tests may also be done if symptoms are severe or if the reason for the nausea and vomiting is not clear. Tests may include:  Urine tests.  Blood tests.  Stool tests.  Cultures (to look for evidence of infection).  X-rays or other imaging studies. Test results can help your caregiver make decisions about treatment or the need for additional tests. TREATMENT You need to stay well hydrated. Drink frequently but in small amounts.You may wish to drink water, sports drinks, clear broth, or eat frozen ice pops or gelatin dessert to help stay hydrated.When you eat, eating slowly may help prevent nausea.There are also some antinausea medicines that may help prevent nausea. HOME CARE INSTRUCTIONS   Take all medicine as directed by your caregiver.  If you do not have an appetite, do not force yourself to eat. However, you must continue to drink fluids.  If you have an appetite, eat a normal diet unless your caregiver tells you differently.  Eat a variety of complex carbohydrates (rice, wheat, potatoes, bread), lean meats, yogurt, fruits, and vegetables.  Avoid high-fat foods because they are more difficult to digest.  Drink enough water and fluids to keep your urine clear or pale yellow.  If you are dehydrated, ask your caregiver for specific rehydration instructions. Signs of dehydration may include:  Severe thirst.  Dry lips and mouth.  Dizziness.  Dark urine.  Decreasing urine frequency and amount.  Confusion.  Rapid breathing or pulse. SEEK IMMEDIATE MEDICAL CARE IF:   You have blood or brown flecks (like coffee grounds) in your vomit.  You have black or bloody stools.  You have a severe headache or stiff neck.  You are confused.  You have severe abdominal pain.  You have chest pain or trouble breathing.  You do not urinate at least once every 8  hours.  You develop cold or clammy skin.  You continue to vomit for longer than 24 to 48 hours.  You have a fever. MAKE SURE YOU:   Understand these instructions.  Will watch your condition.  Will get help right away if you are not doing well or get worse. Document Released: 03/07/2005 Document Revised: 05/30/2011 Document Reviewed: 08/04/2010 [facility] Patient Information 2015 [place], [place]. This information is not intended to replace advice given to you by your health care provider. Make sure you discuss any questions you have with your health care provider.  Migraine Headache A migraine headache is an intense, throbbing pain on one or both sides of your head. A migraine can last for 30 minutes to several hours. CAUSES  The exact cause of a migraine headache is not always known. However, a migraine may be caused when nerves in the brain become irritated and release chemicals that cause inflammation. This causes pain. Certain things may also trigger migraines, such as:  Alcohol.  Smoking.  Stress.  Menstruation.  Aged cheeses.  Foods or drinks that contain nitrates, glutamate, aspartame, or tyramine.  Lack of sleep.  Chocolate.  Caffeine.  Hunger.  Physical exertion.  Fatigue.  Medicines used to treat chest pain (nitroglycerine), birth control pills, estrogen, and some blood pressure medicines. SIGNS AND SYMPTOMS  Pain on one or both sides of your head.  Pulsating or throbbing pain.  Severe pain that prevents daily activities.  Pain that is aggravated by any physical activity.  Nausea, vomiting, or both.  Dizziness.  Pain with exposure to bright lights, loud noises, or activity.  General sensitivity to bright lights, loud noises, or smells. Before you get a migraine, you may get warning signs that a migraine is coming (aura). An aura may include:  Seeing flashing lights.  Seeing bright spots, halos, or zig-zag lines.  Having tunnel vision or  blurred vision.  Having feelings of numbness or tingling.  Having trouble talking.  Having muscle weakness. DIAGNOSIS  A migraine headache is often diagnosed based on:  Symptoms.  Physical exam.  A CT scan or MRI of your head. These imaging tests cannot diagnose migraines, but they can help rule out other causes of headaches. TREATMENT Medicines may be given for pain and nausea. Medicines can also be given to help prevent recurrent migraines.  HOME CARE INSTRUCTIONS  Only take over-the-counter or prescription medicines for pain or discomfort as directed by your health care provider. The use of long-term narcotics is not recommended.  Lie down in a dark, quiet room when you have a migraine.  Keep a journal to find out what may trigger your migraine headaches. For example, write down:  What you eat and drink.  How much sleep you get.  Any change to your diet or medicines.  Limit alcohol consumption.  Quit smoking if you smoke.  Get 7-9 hours of sleep, or as recommended by your health care provider.  Limit stress.  Keep lights dim if bright lights bother you and make your migraines worse. SEEK IMMEDIATE MEDICAL CARE IF:   Your migraine becomes severe.  You have a fever.  You have a stiff neck.  You have vision loss.  You have muscular weakness or loss of muscle control.  You start losing your balance or have trouble walking.  You feel faint or pass out.  You have severe symptoms that are different from your first symptoms. MAKE SURE YOU:   Understand these instructions.  Will watch your condition.  Will get help right away if you are not doing well or get worse. Document Released: 03/07/2005 Document Revised: 12/26/2012 Document Reviewed: 11/12/2012 [facility] Patient Information 2015 [place], [place]. This information is not intended to replace advice given to you by your health care provider. Make sure you discuss any questions you have with your health  care provider.  Hematuria, Adult Hematuria is blood in your urine. It can be caused by a bladder infection, kidney infection, prostate infection, kidney stone, or cancer of your urinary tract. Infections can usually be treated with medicine, and a kidney stone usually will pass through your urine. If neither of these is the cause of your hematuria, further workup to find out the reason may be needed. It is very important that you tell your health care provider about any blood you see in your urine, even if the blood stops without treatment or happens without causing pain. Blood in your urine that happens and then stops and then happens again can be a symptom of a very serious condition. Also, pain is not a symptom in the initial stages of many urinary cancers. HOME CARE INSTRUCTIONS   Drink lots of fluid, 3-4 quarts a day. If you have been diagnosed with an infection, cranberry juice is especially recommended, in addition to large amounts of water.  Avoid caffeine, tea, and carbonated beverages, because they tend to irritate  the bladder.  Avoid alcohol because it may irritate the prostate.  Only take over-the-counter or prescription medicines for pain, discomfort, or fever as directed by your health care provider.  If you have been diagnosed with a kidney stone, follow your health care provider's instructions regarding straining your urine to catch the stone.  Empty your bladder often. Avoid holding urine for long periods of time.  After a bowel movement, women should cleanse front to back. Use each tissue only once.  Empty your bladder before and after sexual intercourse if you are a female. SEEK MEDICAL CARE IF: You develop back pain, fever, a feeling of sickness in your stomach (nausea), or vomiting or if your symptoms are not better in 3 days. Return sooner if you are getting worse. SEEK IMMEDIATE MEDICAL CARE IF:   You have a persistent fever, with a temperature of 101.54F (38.8C) or  greater.  You develop severe vomiting and are unable to keep the medicine down.  You develop severe back or abdominal pain despite taking your medicines.  You begin passing a large amount of blood or clots in your urine.  You feel extremely weak or faint, or you pass out. MAKE SURE YOU:   Understand these instructions.  Will watch your condition.  Will get help right away if you are not doing well or get worse. Document Released: 03/07/2005 Document Revised: 12/26/2012 Document Reviewed: 11/05/2012 [facility] Patient Information 2015 [place], [place]. This information is not intended to replace advice given to you by your health care provider. Make sure you discuss any questions you have with your health care provider.

## 2013-09-20 NOTE — ED Provider Notes (Signed)
Medical screening examination/treatment/procedure(s) were performed by non-physician practitioner and as supervising physician I was immediately available for consultation/collaboration.   EKG Interpretation None       Threasa Beards, MD 09/20/13 2010

## 2013-09-26 ENCOUNTER — Encounter: Payer: Self-pay | Admitting: *Deleted

## 2013-09-30 ENCOUNTER — Encounter: Payer: Self-pay | Admitting: Neurology

## 2013-09-30 ENCOUNTER — Ambulatory Visit (INDEPENDENT_AMBULATORY_CARE_PROVIDER_SITE_OTHER): Payer: Medicare Other | Admitting: Neurology

## 2013-09-30 VITALS — BP 140/76 | HR 76 | Temp 98.6°F | Resp 18 | Ht 63.0 in | Wt 207.6 lb

## 2013-09-30 DIAGNOSIS — G44219 Episodic tension-type headache, not intractable: Secondary | ICD-10-CM

## 2013-09-30 DIAGNOSIS — R110 Nausea: Secondary | ICD-10-CM

## 2013-09-30 MED ORDER — NORTRIPTYLINE HCL 10 MG PO CAPS: 10.0000 mg | ORAL_CAPSULE | Freq: Every day | ORAL | Status: DC
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
## 2013-09-30 NOTE — Patient Instructions (Addendum)
1.  To try and reduce frequency of the headaches, we will start nortriptyline 10mg  at bedtime.  It is an antidepressant as well.  Side effects may include sleepiness, dizziness or dry mouth.  All antidepressants have warning of increased suicidal ideation. 2.  Use the ibuprofen 800mg  for the headache.  Limit use of all pain relievers to no more than 2 days out of the week if possible to prevent medication-overuse headache.  Call if you need a refill. 3.  Limit use of caffeine 4.  Call in 4 weeks with update and we can adjust dose if needed.  Follow up in 3 months.

## 2013-10-07 ENCOUNTER — Telehealth: Payer: Self-pay | Admitting: Neurology

## 2013-10-07 NOTE — Telephone Encounter (Signed)
Please advise. She also wanted you to know that she was dx with pinched nerves in her neck & lower back last week.
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
## 2013-10-07 NOTE — Telephone Encounter (Signed)
I won't be able to write a letter, but she may get a copy of my note.

## 2013-10-07 NOTE — Telephone Encounter (Signed)
Patient was notified.

## 2013-10-23 ENCOUNTER — Other Ambulatory Visit: Payer: Self-pay | Admitting: Neurology

## 2013-11-13 ENCOUNTER — Encounter (HOSPITAL_COMMUNITY): Payer: Self-pay | Admitting: Emergency Medicine

## 2013-11-13 ENCOUNTER — Emergency Department (HOSPITAL_COMMUNITY)
Admission: EM | Admit: 2013-11-13 | Discharge: 2013-11-13 | Disposition: A | Discharge status: Home / Self Care | Payer: Medicare Other | Attending: Emergency Medicine | Admitting: Emergency Medicine

## 2013-11-13 DIAGNOSIS — Z860100 Personal history of colon polyps, unspecified: Secondary | ICD-10-CM | POA: Insufficient documentation

## 2013-11-13 DIAGNOSIS — M546 Pain in thoracic spine: Secondary | ICD-10-CM | POA: Diagnosis not present

## 2013-11-13 DIAGNOSIS — M545 Low back pain, unspecified: Secondary | ICD-10-CM | POA: Insufficient documentation

## 2013-11-13 DIAGNOSIS — G8929 Other chronic pain: Secondary | ICD-10-CM | POA: Diagnosis not present

## 2013-11-13 DIAGNOSIS — M542 Cervicalgia: Secondary | ICD-10-CM | POA: Diagnosis not present

## 2013-11-13 DIAGNOSIS — Z862 Personal history of diseases of the blood and blood-forming organs and certain disorders involving the immune mechanism: Secondary | ICD-10-CM | POA: Insufficient documentation

## 2013-11-13 DIAGNOSIS — M549 Dorsalgia, unspecified: Secondary | ICD-10-CM

## 2013-11-13 DIAGNOSIS — M129 Arthropathy, unspecified: Secondary | ICD-10-CM | POA: Diagnosis not present

## 2013-11-13 DIAGNOSIS — Z79899 Other long term (current) drug therapy: Secondary | ICD-10-CM | POA: Insufficient documentation

## 2013-11-13 DIAGNOSIS — Z8719 Personal history of other diseases of the digestive system: Secondary | ICD-10-CM | POA: Insufficient documentation

## 2013-11-13 DIAGNOSIS — Z791 Long term (current) use of non-steroidal anti-inflammatories (NSAID): Secondary | ICD-10-CM | POA: Diagnosis not present

## 2013-11-13 DIAGNOSIS — F17200 Nicotine dependence, unspecified, uncomplicated: Secondary | ICD-10-CM | POA: Diagnosis not present

## 2013-11-13 DIAGNOSIS — F411 Generalized anxiety disorder: Secondary | ICD-10-CM | POA: Insufficient documentation

## 2013-11-13 DIAGNOSIS — G43909 Migraine, unspecified, not intractable, without status migrainosus: Secondary | ICD-10-CM | POA: Diagnosis not present

## 2013-11-13 DIAGNOSIS — I10 Essential (primary) hypertension: Secondary | ICD-10-CM | POA: Diagnosis not present

## 2013-11-13 MED ORDER — HYDROCODONE-ACETAMINOPHEN 5-325 MG PO TABS: 1.0000 | ORAL_TABLET | ORAL | Status: DC | PRN

## 2013-11-13 MED ORDER — NAPROXEN 500 MG PO TABS: 500.0000 mg | ORAL_TABLET | Freq: Two times a day (BID) | ORAL | Status: DC

## 2013-11-13 MED ORDER — METHOCARBAMOL 500 MG PO TABS
500.0000 mg | ORAL_TABLET | Freq: Two times a day (BID) | ORAL | Status: DC
Start: 2013-11-13 — End: 2013-11-19
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
## 2013-11-13 NOTE — Discharge Instructions (Signed)
Use conservative methods at home including heat therapy and cold therapy as we discussed. More information on cold therapy is listed below.  It is not recommended to use heat treatment directly after an acute injury.  Take pain medication and muscle relaxer as needed for pain.  Do not drive or operate heavy machinery for 4-6 hours after taking pain medication.  SEEK IMMEDIATE MEDICAL ATTENTION IF: New numbness, tingling, weakness, or problem with the use of your arms or legs.  Severe back pain not relieved with medications.  Change in bowel or bladder control.  Increasing pain in any areas of the body (such as chest or abdominal pain).  Shortness of breath, dizziness or fainting.  Nausea (feeling sick to your stomach), vomiting, fever, or sweats.  COLD THERAPY DIRECTIONS:  Ice or gel packs can be used to reduce both pain and swelling. Ice is the most helpful within the first 24 to 48 hours after an injury or flareup from overusing a muscle or joint.  Ice is effective, has very few side effects, and is safe for most people to use.   If you expose your skin to cold temperatures for too long or without the proper protection, you can damage your skin or nerves. Watch for signs of skin damage due to cold.   HOME CARE INSTRUCTIONS  Follow these tips to use ice and cold packs safely.  Place a dry or damp towel between the ice and skin. A damp towel will cool the skin more quickly, so you may need to shorten the time that the ice is used.  For a more rapid response, add gentle compression to the ice.  Ice for no more than 10 to 20 minutes at a time. The bonier the area you are icing, the less time it will take to get the benefits of ice.  Check your skin after 5 minutes to make sure there are no signs of a poor response to cold or skin damage.  Rest 20 minutes or more in between uses.  Once your skin is numb, you can end your treatment. You can test numbness by very lightly touching your skin. The  touch should be so light that you do not see the skin dimple from the pressure of your fingertip. When using ice, most people will feel these normal sensations in this order: cold, burning, aching, and numbness.  Do not use ice on someone who cannot communicate their responses to pain, such as small children or people with dementia.   HOW TO MAKE AN ICE PACK  To make an ice pack, do one of the following:  Place crushed ice or a bag of frozen vegetables in a sealable plastic bag. Squeeze out the excess air. Place this bag inside another plastic bag. Slide the bag into a pillowcase or place a damp towel between your skin and the bag.  Mix 3 parts water with 1 part rubbing alcohol. Freeze the mixture in a sealable plastic bag. When you remove the mixture from the freezer, it will be slushy. Squeeze out the excess air. Place this bag inside another plastic bag. Slide the bag into a pillowcase or place a damp towel between your skin and the bag.   SEEK MEDICAL CARE IF:  You develop white spots on your skin. This may give the skin a blotchy (mottled) appearance.  Your skin turns blue or pale.  Your skin becomes waxy or hard.  Your swelling gets worse.  MAKE SURE YOU:  Understand  these instructions.  Will watch your condition.  Will get help right away if you are not doing well or get worse.    Chronic Pain Discharge Instructions  Emergency care providers appreciate that many patients coming to [place] are in severe pain and we wish to address their pain in the safest, most responsible manner.  It is important to recognize however, that the proper treatment of chronic pain differs from that of the pain of injuries and acute illnesses.  Our goal is to provide quality, safe, personalized care and we thank you for giving [place] the opportunity to serve you. The use of narcotics and related agents for chronic pain syndromes may lead to additional physical and psychological problems.  Nearly as many people die from  prescription narcotics each year as die from car crashes.  Additionally, this risk is increased if such prescriptions are obtained from a variety of sources.  Therefore, only your primary care physician or a pain management specialist is able to safely treat such syndromes with narcotic medications long-term.    Documentation revealing such prescriptions have been sought from multiple sources may prohibit [place] from providing a refill or different narcotic medication.  Your name may be checked first through the [facility] [place].  This database is a record of controlled substance medication prescriptions that the patient has received.  This has been established by [facility] in an effort to eliminate the dangerous, and often life threatening, practice of obtaining multiple prescriptions from different medical providers.   If you have a chronic pain syndrome (i.e. chronic headaches, recurrent back or neck pain, dental pain, abdominal or pelvis pain without a specific diagnosis, or neuropathic pain such as fibromyalgia) or recurrent visits for the same condition without an acute diagnosis, you may be treated with non-narcotics and other non-addictive medicines.  Allergic reactions or negative side effects that may be reported by a patient to such medications will not typically lead to the use of a narcotic analgesic or other controlled substance as an alternative.   Patients managing chronic pain with a personal physician should have provisions in place for breakthrough pain.  If you are in crisis, you should call your physician.  If your physician directs you to the emergency department, please have the doctor call and speak to our attending physician concerning your care.   When patients come to the Emergency Department (ED) with acute medical conditions in which the Emergency Department physician feels appropriate to prescribe narcotic or sedating pain medication, the  physician will prescribe these in very limited quantities.  The amount of these medications will last only until you can see your primary care physician in his/her office.  Any patient who returns to the ED seeking refills should expect only non-narcotic pain medications.   In the event of an acute medical condition exists and the emergency physician feels it is necessary that the patient be given a narcotic or sedating medication -  a responsible adult driver should be present in the room prior to the medication being given by the nurse.   Prescriptions for narcotic or sedating medications that have been lost, stolen or expired will not be refilled in the Emergency Department.    Patients who have chronic pain may receive non-narcotic prescriptions until seen by their primary care physician.  It is every patients personal responsibility to maintain active prescriptions with his or her primary care physician or specialist.

## 2013-11-13 NOTE — ED Provider Notes (Signed)
Medical screening examination/treatment/procedure(s) were performed by non-physician practitioner and as supervising physician I was immediately available for consultation/collaboration.   EKG Interpretation None        Pamella Pert, MD 11/13/13 2348
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
## 2013-11-19 ENCOUNTER — Encounter (HOSPITAL_COMMUNITY): Payer: Self-pay | Admitting: Emergency Medicine

## 2013-11-19 ENCOUNTER — Telehealth: Payer: Self-pay | Admitting: Neurology

## 2013-11-19 ENCOUNTER — Emergency Department (HOSPITAL_COMMUNITY)
Admission: EM | Admit: 2013-11-19 | Discharge: 2013-11-19 | Disposition: A | Discharge status: Home / Self Care | Payer: Medicare Other | Attending: Emergency Medicine | Admitting: Emergency Medicine

## 2013-11-19 ENCOUNTER — Emergency Department (HOSPITAL_COMMUNITY): Payer: Medicare Other

## 2013-11-19 DIAGNOSIS — T40601A Poisoning by unspecified narcotics, accidental (unintentional), initial encounter: Secondary | ICD-10-CM | POA: Diagnosis not present

## 2013-11-19 DIAGNOSIS — Z79899 Other long term (current) drug therapy: Secondary | ICD-10-CM | POA: Diagnosis not present

## 2013-11-19 DIAGNOSIS — Z862 Personal history of diseases of the blood and blood-forming organs and certain disorders involving the immune mechanism: Secondary | ICD-10-CM | POA: Insufficient documentation

## 2013-11-19 DIAGNOSIS — Y9389 Activity, other specified: Secondary | ICD-10-CM | POA: Insufficient documentation

## 2013-11-19 DIAGNOSIS — M542 Cervicalgia: Secondary | ICD-10-CM | POA: Insufficient documentation

## 2013-11-19 DIAGNOSIS — M129 Arthropathy, unspecified: Secondary | ICD-10-CM | POA: Insufficient documentation

## 2013-11-19 DIAGNOSIS — R110 Nausea: Secondary | ICD-10-CM | POA: Diagnosis not present

## 2013-11-19 DIAGNOSIS — I10 Essential (primary) hypertension: Secondary | ICD-10-CM | POA: Insufficient documentation

## 2013-11-19 DIAGNOSIS — Z860100 Personal history of colon polyps, unspecified: Secondary | ICD-10-CM | POA: Insufficient documentation

## 2013-11-19 DIAGNOSIS — Z87828 Personal history of other (healed) physical injury and trauma: Secondary | ICD-10-CM | POA: Diagnosis not present

## 2013-11-19 DIAGNOSIS — G8929 Other chronic pain: Secondary | ICD-10-CM | POA: Insufficient documentation

## 2013-11-19 DIAGNOSIS — Y9289 Other specified places as the place of occurrence of the external cause: Secondary | ICD-10-CM | POA: Diagnosis not present

## 2013-11-19 DIAGNOSIS — T424X1A Poisoning by benzodiazepines, accidental (unintentional), initial encounter: Secondary | ICD-10-CM | POA: Insufficient documentation

## 2013-11-19 DIAGNOSIS — F17200 Nicotine dependence, unspecified, uncomplicated: Secondary | ICD-10-CM | POA: Diagnosis not present

## 2013-11-19 DIAGNOSIS — F411 Generalized anxiety disorder: Secondary | ICD-10-CM | POA: Diagnosis not present

## 2013-11-19 DIAGNOSIS — T424X4A Poisoning by benzodiazepines, undetermined, initial encounter: Secondary | ICD-10-CM | POA: Diagnosis present

## 2013-11-19 DIAGNOSIS — T400X1A Poisoning by opium, accidental (unintentional), initial encounter: Secondary | ICD-10-CM | POA: Insufficient documentation

## 2013-11-19 DIAGNOSIS — E663 Overweight: Secondary | ICD-10-CM | POA: Diagnosis not present

## 2013-11-19 DIAGNOSIS — M549 Dorsalgia, unspecified: Secondary | ICD-10-CM | POA: Insufficient documentation

## 2013-11-19 DIAGNOSIS — Z791 Long term (current) use of non-steroidal anti-inflammatories (NSAID): Secondary | ICD-10-CM | POA: Diagnosis not present

## 2013-11-19 DIAGNOSIS — Z8719 Personal history of other diseases of the digestive system: Secondary | ICD-10-CM | POA: Insufficient documentation

## 2013-11-19 DIAGNOSIS — R0602 Shortness of breath: Secondary | ICD-10-CM | POA: Diagnosis not present

## 2013-11-19 DIAGNOSIS — T50901A Poisoning by unspecified drugs, medicaments and biological substances, accidental (unintentional), initial encounter: Secondary | ICD-10-CM

## 2013-11-19 LAB — COMPREHENSIVE METABOLIC PANEL
ALBUMIN: 3.6 g/dL (ref 3.5–5.2)
ALT: 29 U/L (ref 0–35)
AST: 39 U/L — AB (ref 0–37)
Alkaline Phosphatase: 94 U/L (ref 39–117)
Anion gap: 14 (ref 5–15)
BUN: 8 mg/dL (ref 6–23)
CALCIUM: 9 mg/dL (ref 8.4–10.5)
CO2: 22 mEq/L (ref 19–32)
CREATININE: 0.64 mg/dL (ref 0.50–1.10)
Chloride: 99 mEq/L (ref 96–112)
GFR calc Af Amer: >90 mL/min (ref >90)
GFR calc non Af Amer: >90 mL/min (ref >90)
Glucose, Bld: 106 mg/dL — ABNORMAL HIGH (ref 70–99)
Potassium: 4.2 mEq/L (ref 3.7–5.3)
Sodium: 135 mEq/L — ABNORMAL LOW (ref 137–147)
Total Bilirubin: <0.2 mg/dL — ABNORMAL LOW (ref 0.3–1.2)
Total Protein: 7.8 g/dL (ref 6.0–8.3)

## 2013-11-19 LAB — CBC WITH DIFFERENTIAL/PLATELET
BASOS ABS: 0.1 10*3/uL (ref 0.0–0.1)
Basophils Relative: 1 % (ref 0–1)
EOS PCT: 2 % (ref 0–5)
Eosinophils Absolute: 0.1 10*3/uL (ref 0.0–0.7)
HEMATOCRIT: 35.8 % — AB (ref 36.0–46.0)
Hemoglobin: 11.8 g/dL — ABNORMAL LOW (ref 12.0–15.0)
Lymphocytes Relative: 37 % (ref 12–46)
Lymphs Abs: 2 10*3/uL (ref 0.7–4.0)
MCH: 28.6 pg (ref 26.0–34.0)
MCHC: 33 g/dL (ref 30.0–36.0)
MCV: 86.7 fL (ref 78.0–100.0)
MONO ABS: 0.4 10*3/uL (ref 0.1–1.0)
Monocytes Relative: 8 % (ref 3–12)
Neutro Abs: 2.9 10*3/uL (ref 1.7–7.7)
Neutrophils Relative %: 52 % (ref 43–77)
Platelets: 313 10*3/uL (ref 150–400)
RBC: 4.13 MIL/uL (ref 3.87–5.11)
RDW: 16.1 % — AB (ref 11.5–15.5)
WBC: 5.5 10*3/uL (ref 4.0–10.5)

## 2013-11-19 LAB — RAPID URINE DRUG SCREEN, HOSP PERFORMED
Amphetamines: NOT DETECTED
BARBITURATES: NOT DETECTED
Benzodiazepines: POSITIVE — AB
COCAINE: NOT DETECTED
OPIATES: POSITIVE — AB
Tetrahydrocannabinol: NOT DETECTED

## 2013-11-19 LAB — URINALYSIS, ROUTINE W REFLEX MICROSCOPIC
Bilirubin Urine: NEGATIVE
Glucose, UA: NEGATIVE mg/dL
Hgb urine dipstick: NEGATIVE
Ketones, ur: NEGATIVE mg/dL
Leukocytes, UA: NEGATIVE
Nitrite: NEGATIVE
Protein, ur: NEGATIVE mg/dL
SPECIFIC GRAVITY, URINE: 1.039 — AB (ref 1.005–1.030)
UROBILINOGEN UA: 0.2 mg/dL (ref 0.0–1.0)
pH: 5.5 (ref 5.0–8.0)

## 2013-11-19 LAB — ACETAMINOPHEN LEVEL

## 2013-11-19 LAB — TROPONIN I: Troponin I: <0.3 ng/mL (ref <0.30)

## 2013-11-19 LAB — SALICYLATE LEVEL

## 2013-11-19 MED ORDER — ONDANSETRON HCL 4 MG/2ML IJ SOLN
4.0000 mg | Freq: Once | INTRAMUSCULAR | Status: DC
  Filled 2013-11-19: qty 2

## 2013-11-19 NOTE — ED Notes (Signed)
Pt reports burning throat and ears, upper and lower back pain, headache, and left sided chest pain. Pt reports having this pain chronically and took daily medications but does not know how much to relieve the pain. Pt denies SI/HI just reports was trying to get the pain to go away.

## 2013-11-19 NOTE — Discharge Instructions (Signed)
Accidental Overdose You take a lot of sedating medications.  You need to be very careful to take your medications as directed.  Drug overdose, even accidental, can be fatal.  A drug overdose occurs when a chemical substance (drug or medication) is used in amounts large enough to overcome a person. This may result in severe illness or death. This is a type of poisoning. Accidental overdoses of medications or other substances come from a variety of reasons. When this happens accidentally, it is often because the person taking the substance does not know enough about what they have taken. Drugs which commonly cause overdose deaths are alcohol, psychotropic medications (medications which affect the mind), pain medications, illegal drugs (street drugs) such as cocaine and heroin, and multiple drugs taken at the same time. It may result from careless behavior (such as over-indulging at a party). Other causes of overdose may include multiple drug use, a lapse in memory, or drug use after a period of no drug use.  Sometimes overdosing occurs because a person cannot remember if they have taken their medication.  A common unintentional overdose in young children involves multi-vitamins containing iron. Iron is a part of the hemoglobin molecule in blood. It is used to transport oxygen to living cells. When taken in small amounts, iron allows the body to restock hemoglobin. In large amounts, it causes problems in the body. If this overdose is not treated, it can lead to death. Never take medicines that show signs of tampering or do not seem quite right. Never take medicines in the dark or in poor lighting. Read the label and check each dose of medicine before you take it. When adults are poisoned, it happens most often through carelessness or lack of information. Taking medicines in the dark or taking medicine prescribed for someone else to treat the same type of problem is a dangerous practice. SYMPTOMS  Symptoms of  overdose depend on the medication and amount taken. They can vary from over-activity with stimulant over-dosage, to sleepiness from depressants such as alcohol, narcotics and tranquilizers. Confusion, dizziness, nausea and vomiting may be present. If problems are severe enough coma and death may result. DIAGNOSIS  Diagnosis and management are generally straightforward if the drug is known. Otherwise it is more difficult. At times, certain symptoms and signs exhibited by the patient, or blood tests, can reveal the drug in question.  TREATMENT  In an emergency department, most patients can be treated with supportive measures. Antidotes may be available if there has been an overdose of opioids or benzodiazepines. A rapid improvement will often occur if this is the cause of overdose. At home or away from medical care:  There may be no immediate problems or warning signs in children.  Not everything works well in all cases of poisoning.  Take immediate action. Poisons may act quickly.  If you think someone has swallowed medicine or a household product, and the person is unconscious, having seizures (convulsions), or is not breathing, immediately call for an ambulance. IF a person is conscious and appears to be doing OK but has swallowed a poison:  Do not wait to see what effect the poison will have. Immediately call a poison control center (listed in the white pages of your telephone book under "Poison Control" or inside the front cover with other emergency numbers). Some poison control centers have TTY capability for the deaf. Check with your local center if you or someone in your family requires this service.  Keep the container  so you can read the label on the product for ingredients.  Describe what, when, and how much was taken and the age and condition of the person poisoned. Inform them if the person is vomiting, choking, drowsy, shows a change in color or temperature of skin, is conscious or  unconscious, or is convulsing.  Do not cause vomiting unless instructed by medical personnel. Do not induce vomiting or force liquids into a person who is convulsing, unconscious, or very drowsy. Stay calm and in control.   Activated charcoal also is sometimes used in certain types of poisoning and you may wish to add a supply to your emergency medicines. It is available without a prescription. Call a poison control center before using this medication. PREVENTION  Thousands of children die every year from unintentional poisoning. This may be from household chemicals, poisoning from carbon monoxide in a car, taking their parent's medications, or simply taking a few iron pills or vitamins with iron. Poisoning comes from unexpected sources.  Store medicines out of the sight and reach of children, preferably in a locked cabinet. Do not keep medications in a food cabinet. Always store your medicines in a secure place. Get rid of expired medications.  If you have children living with you or have them as occasional guests, you should have child-resistant caps on your medicine containers. Keep everything out of reach. Child proof your home.  If you are called to the telephone or to answer the door while you are taking a medicine, take the container with you or put the medicine out of the reach of small children.  Do not take your medication in front of children. Do not tell your child how good a medication is and how good it is for them. They may get the idea it is more of a treat.  If you are an adult and have accidentally taken an overdose, you need to consider how this happened and what can be done to prevent it from happening again. If this was from a street drug or alcohol, determine if there is a problem that needs addressing. If you are not sure a problems exists, it is easy to talk to a professional and ask them if they think you have a problem. It is better to handle this problem in this way before  it happens again and has a much worse consequence. Document Released: 05/21/2004 Document Revised: 05/30/2011 Document Reviewed: 10/27/2008 [facility] Patient Information 2015 [place], [place]. This information is not intended to replace advice given to you by your health care provider. Make sure you discuss any questions you have with your health care provider.

## 2013-11-19 NOTE — ED Notes (Signed)
Brought in by EMS from home with c/o shortness of breath and generalized weakness.  Pt reported that she took her medications more than the prescribed dose last night "to feel better"--- pt unable to tell how many "extra medications" she took--- pt took neurontin, naproxen, alprazolam, hydrocodone/APAP, nortiptyline, and zofran.  Pt woke up with shortness of breath.  Pt's O2 sat 97% on room air on EMS' arrival--- pt was given O2 at 2LPM via [place], O2 sat went up to 100%.  Pt presents to ED A/Ox4, awake and verbally responsive and appropriate.  Pt denies SI/HI.

## 2013-11-19 NOTE — Telephone Encounter (Signed)
Pt called had a medical overdose this morning and wants to check when she can start taking her meds again.  C/B [phone]

## 2013-11-19 NOTE — ED Notes (Addendum)
Pt hyperventilating and states unable to catch breath. Pt encouraged to take slow deep breaths and placed on 2 lpm [place] for comfort. Pt reports taking 24 mg of zofran at 0300; MD notified; hold zofran at present time.

## 2013-11-19 NOTE — ED Notes (Signed)
Bed: JK82 Expected date:  Expected time:  Means of arrival:  Comments: EMS shortness of breath, gen weakness took extra pain med
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
## 2013-11-19 NOTE — ED Notes (Signed)
Pt reports pain decrease and only a little nausea. Pt resting at present time.

## 2013-11-19 NOTE — ED Notes (Signed)
Leah Fox/daughter contact information [phone].

## 2013-11-20 ENCOUNTER — Telehealth: Payer: Self-pay | Admitting: *Deleted

## 2013-11-20 NOTE — Telephone Encounter (Signed)
Patient is aware to stop her nortriptyline all together and I will call in cymbalta for her 30 mg daily for 1 week then 60mg  daily  I advised her not to start taking until she has a follow up with her PCP as when to start taking any pain medication due to her situation .

## 2013-11-20 NOTE — Telephone Encounter (Signed)
Message copied by Claudie Revering on Wed Nov 20, 2013 10:15 AM ------      Message from: JAFFE, ADAM R      Created: Tue Nov 19, 2013  3:48 PM       I want to just stop the nortriptyline all together.  Instead, I want to start Cymbalta 30mg  daily for one week, then 60mg  daily.  For diagnosis, we can put chronic pain. ------

## 2013-12-02 ENCOUNTER — Encounter (HOSPITAL_COMMUNITY): Payer: Self-pay | Admitting: Emergency Medicine

## 2013-12-02 ENCOUNTER — Emergency Department (HOSPITAL_COMMUNITY)
Admission: EM | Admit: 2013-12-02 | Discharge: 2013-12-02 | Disposition: A | Discharge status: Home / Self Care | Payer: Medicare Other | Attending: Emergency Medicine | Admitting: Emergency Medicine

## 2013-12-02 ENCOUNTER — Emergency Department (HOSPITAL_COMMUNITY): Payer: Medicare Other

## 2013-12-02 DIAGNOSIS — Y9289 Other specified places as the place of occurrence of the external cause: Secondary | ICD-10-CM | POA: Diagnosis not present

## 2013-12-02 DIAGNOSIS — Z791 Long term (current) use of non-steroidal anti-inflammatories (NSAID): Secondary | ICD-10-CM | POA: Insufficient documentation

## 2013-12-02 DIAGNOSIS — Z79899 Other long term (current) drug therapy: Secondary | ICD-10-CM | POA: Diagnosis not present

## 2013-12-02 DIAGNOSIS — I10 Essential (primary) hypertension: Secondary | ICD-10-CM | POA: Diagnosis not present

## 2013-12-02 DIAGNOSIS — M542 Cervicalgia: Secondary | ICD-10-CM

## 2013-12-02 DIAGNOSIS — W19XXXA Unspecified fall, initial encounter: Secondary | ICD-10-CM

## 2013-12-02 DIAGNOSIS — Z8719 Personal history of other diseases of the digestive system: Secondary | ICD-10-CM | POA: Diagnosis not present

## 2013-12-02 DIAGNOSIS — S4980XA Other specified injuries of shoulder and upper arm, unspecified arm, initial encounter: Secondary | ICD-10-CM | POA: Diagnosis not present

## 2013-12-02 DIAGNOSIS — F411 Generalized anxiety disorder: Secondary | ICD-10-CM | POA: Diagnosis not present

## 2013-12-02 DIAGNOSIS — S46909A Unspecified injury of unspecified muscle, fascia and tendon at shoulder and upper arm level, unspecified arm, initial encounter: Secondary | ICD-10-CM | POA: Diagnosis not present

## 2013-12-02 DIAGNOSIS — G43909 Migraine, unspecified, not intractable, without status migrainosus: Secondary | ICD-10-CM | POA: Diagnosis not present

## 2013-12-02 DIAGNOSIS — IMO0002 Reserved for concepts with insufficient information to code with codable children: Secondary | ICD-10-CM | POA: Insufficient documentation

## 2013-12-02 DIAGNOSIS — S0993XA Unspecified injury of face, initial encounter: Secondary | ICD-10-CM | POA: Diagnosis not present

## 2013-12-02 DIAGNOSIS — Z862 Personal history of diseases of the blood and blood-forming organs and certain disorders involving the immune mechanism: Secondary | ICD-10-CM | POA: Insufficient documentation

## 2013-12-02 DIAGNOSIS — M129 Arthropathy, unspecified: Secondary | ICD-10-CM | POA: Diagnosis not present

## 2013-12-02 DIAGNOSIS — M549 Dorsalgia, unspecified: Secondary | ICD-10-CM

## 2013-12-02 DIAGNOSIS — Z860100 Personal history of colon polyps, unspecified: Secondary | ICD-10-CM | POA: Insufficient documentation

## 2013-12-02 DIAGNOSIS — G8929 Other chronic pain: Secondary | ICD-10-CM | POA: Diagnosis not present

## 2013-12-02 DIAGNOSIS — F17200 Nicotine dependence, unspecified, uncomplicated: Secondary | ICD-10-CM | POA: Insufficient documentation

## 2013-12-02 DIAGNOSIS — S199XXA Unspecified injury of neck, initial encounter: Secondary | ICD-10-CM | POA: Diagnosis not present

## 2013-12-02 DIAGNOSIS — F329 Major depressive disorder, single episode, unspecified: Secondary | ICD-10-CM | POA: Insufficient documentation

## 2013-12-02 DIAGNOSIS — W010XXA Fall on same level from slipping, tripping and stumbling without subsequent striking against object, initial encounter: Secondary | ICD-10-CM | POA: Diagnosis not present

## 2013-12-02 DIAGNOSIS — Y9389 Activity, other specified: Secondary | ICD-10-CM | POA: Diagnosis not present

## 2013-12-02 DIAGNOSIS — F3289 Other specified depressive episodes: Secondary | ICD-10-CM | POA: Insufficient documentation

## 2013-12-02 DIAGNOSIS — M25521 Pain in right elbow: Secondary | ICD-10-CM

## 2013-12-02 MED ORDER — METHOCARBAMOL 500 MG PO TABS: 500.0000 mg | ORAL_TABLET | Freq: Two times a day (BID) | ORAL | Status: DC

## 2013-12-02 MED ORDER — DIAZEPAM 5 MG PO TABS
10.0000 mg | ORAL_TABLET | Freq: Once | ORAL | Status: AC
  Administered 2013-12-02: 10 mg via ORAL
  Filled 2013-12-02: qty 2

## 2013-12-02 MED ORDER — HYDROCODONE-ACETAMINOPHEN 5-325 MG PO TABS
2.0000 | ORAL_TABLET | Freq: Once | ORAL | Status: AC
  Administered 2013-12-02: 2 via ORAL
  Filled 2013-12-02: qty 2

## 2013-12-02 MED ORDER — ONDANSETRON 8 MG PO TBDP
8.0000 mg | ORAL_TABLET | Freq: Once | ORAL | Status: AC
  Administered 2013-12-02: 8 mg via ORAL
  Filled 2013-12-02: qty 1

## 2013-12-02 NOTE — Discharge Instructions (Signed)
1. Medications: robaxin, usual home medications including tramadol for pain 2. Treatment: rest, drink plenty of fluids, gentle stretching as discussed, alternate ice and heat 3. Follow Up: Please followup with your primary doctor for discussion of your diagnoses and further evaluation after today's visit; if you do not have a primary care doctor use the resource guide provided to find one;   Back Exercises Back exercises help treat and prevent back injuries. The goal of back exercises is to increase the strength of your abdominal and back muscles and the flexibility of your back. These exercises should be started when you no longer have back pain. Back exercises include:  Pelvic Tilt. Lie on your back with your knees bent. Tilt your pelvis until the lower part of your back is against the floor. Hold this position 5 to 10 sec and repeat 5 to 10 times.  Knee to Chest. Pull first 1 knee up against your chest and hold for 20 to 30 seconds, repeat this with the other knee, and then both knees. This may be done with the other leg straight or bent, whichever feels better.  Sit-Ups or Curl-Ups. Bend your knees 90 degrees. Start with tilting your pelvis, and do a partial, slow sit-up, lifting your trunk only 30 to 45 degrees off the floor. Take at least 2 to 3 seconds for each sit-up. Do not do sit-ups with your knees out straight. If partial sit-ups are difficult, simply do the above but with only tightening your abdominal muscles and holding it as directed.  Hip-Lift. Lie on your back with your knees flexed 90 degrees. Push down with your feet and shoulders as you raise your hips a couple inches off the floor; hold for 10 seconds, repeat 5 to 10 times.  Back arches. Lie on your stomach, propping yourself up on bent elbows. Slowly press on your hands, causing an arch in your low back. Repeat 3 to 5 times. Any initial stiffness and discomfort should lessen with repetition over time.  Shoulder-Lifts. Lie  face down with arms beside your body. Keep hips and torso pressed to floor as you slowly lift your head and shoulders off the floor. Do not overdo your exercises, especially in the beginning. Exercises may cause you some mild back discomfort which lasts for a few minutes; however, if the pain is more severe, or lasts for more than 15 minutes, do not continue exercises until you see your caregiver. Improvement with exercise therapy for back problems is slow.  See your caregivers for assistance with developing a proper back exercise program. Document Released: 04/14/2004 Document Revised: 05/30/2011 Document Reviewed: 01/06/2011 [facility] Patient Information 2015 [place], [place]. This information is not intended to replace advice given to you by your health care provider. Make sure you discuss any questions you have with your health care provider.    Arthralgia Arthralgia is joint pain. A joint is a place where two bones meet. Joint pain can happen for many reasons. The joint can be bruised, stiff, infected, or weak from aging. Pain usually goes away after resting and taking medicine for soreness.  HOME CARE  Rest the joint as told by your doctor.  Keep the sore joint raised (elevated) for the first 24 hours.  Put ice on the joint area.  Put ice in a plastic bag.  Place a towel between your skin and the bag.  Leave the ice on for 15-20 minutes, 03-04 times a day.  Wear your splint, casting, elastic bandage, or sling as  told by your doctor.  Only take medicine as told by your doctor. Do not take aspirin.  Use crutches as told by your doctor. Do not put weight on the joint until told to by your doctor. GET HELP RIGHT AWAY IF:   You have bruising, puffiness (swelling), or more pain.  Your fingers or toes turn blue or start to lose feeling (numb).  Your medicine does not lessen the pain.  Your pain becomes severe.  You have a temperature by mouth above 102 F (38.9 C), not controlled  by medicine.  You cannot move or use the joint. MAKE SURE YOU:   Understand these instructions.  Will watch your condition.  Will get help right away if you are not doing well or get worse. Document Released: 02/23/2009 Document Revised: 05/30/2011 Document Reviewed: 02/23/2009 [facility] Patient Information 2015 [place], [place]. This information is not intended to replace advice given to you by your health care provider. Make sure you discuss any questions you have with your health care provider.
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
## 2013-12-02 NOTE — ED Notes (Signed)
Pt taken off of spine board by Lattie Haw, RN, Devanny Palecek RN, Lisette Grinder, [place].

## 2013-12-03 ENCOUNTER — Telehealth: Payer: Self-pay | Admitting: Neurology

## 2013-12-03 NOTE — Telephone Encounter (Signed)
Pt called requesting for Dr. Tomi Likens to Rx her Bearden. C/B [phone]

## 2013-12-03 NOTE — Telephone Encounter (Signed)
I left message for patient to call office back regarding medication .

## 2013-12-04 ENCOUNTER — Telehealth: Payer: Self-pay | Admitting: *Deleted

## 2013-12-04 NOTE — Telephone Encounter (Signed)
Left message for patient to return call before 430 regarding medication

## 2013-12-04 NOTE — Telephone Encounter (Signed)
Patient returning your call she will not be available between 1 and 2

## 2013-12-05 NOTE — Telephone Encounter (Signed)
I spoke with patient we went over medication several times what she should and should not be taking . I called in the prednisone 10 mg taper to [employer] . She still has enough nortriptyline at this time

## 2013-12-05 NOTE — Telephone Encounter (Signed)
Patient called stating she is having headache since 12/02/13 she had a fall on 12/01/13 but did not hit her head she states the headache was a sudden onset  After the fall. She would like to know if she can resume her nortriptyline . She is currently taking  zofran bid , tramadol 100mg  bid , Vicodin prn QD ,Neurontin 300mg  tid , Zoloft QD and Naproxen QD . Please advise

## 2013-12-05 NOTE — Telephone Encounter (Signed)
We can restart nortriptyline 10mg  at bedtime. I would also prescribe her a prednisone 10mg  taper (Take 6tabs x1day, then 5tabs x1day, then 4tabs x1day, then 3tabs x1day, then 2tabs x1day, then 1tab x1day, then STOP).  While on the prednisone, she should not take any NSAID such as the naproxen.  She should not be taking the naproxen and tramadol daily, otherwise she will always be having a headache.  She should not take any pain reliever more than 2 days out of the week.

## 2013-12-06 NOTE — ED Provider Notes (Signed)
Medical screening examination/treatment/procedure(s) were performed by non-physician practitioner and as supervising physician I was immediately available for consultation/collaboration.   EKG Interpretation None        Houston Siren III, MD 12/06/13 1536

## 2013-12-09 ENCOUNTER — Telehealth: Payer: Self-pay | Admitting: Neurology

## 2013-12-09 NOTE — Telephone Encounter (Signed)
Patient states she will not take the nortriptyline it caused her to have a back side  effect yesterday racing heart and feeling really strange she does not want to quit her neurontin it helps her pain and she needs this medication . I advised her per Dr Tomi Likens to let her PCP or psychologist advise her on what might work for her headache at this point and time since she and had issues with her medications and is having to stop and start most all of them . She states she will call and make an appt asap

## 2013-12-09 NOTE — Telephone Encounter (Signed)
Pt called stating that she is still having headaches and NORTRIPTYLINE 10mg  gave her side effects yesterday.  Pt is going to start new blood pressure medicine that she is going to start today.   She is starting ATENOLOL 25mg  pid today for her high blood pressure. C/B [phone]

## 2013-12-16 ENCOUNTER — Telehealth: Payer: Self-pay | Admitting: Neurology

## 2013-12-16 NOTE — Telephone Encounter (Signed)
Pt called requesting to speak to a nurse to give her update and also she would like For Dr. Tomi Likens to refer her to an endocrinologist. C/B  [phone]

## 2013-12-16 NOTE — Telephone Encounter (Signed)
Left message for patient to return call to office. 

## 2013-12-17 NOTE — Telephone Encounter (Signed)
Pt called/returning your call at 12:29PM C/B [phone]

## 2013-12-18 NOTE — Telephone Encounter (Signed)
Patient states headaches are doing a lot better . She has ask for a referral to endocrinologist I advised her to have her PCP make that referral

## 2014-01-01 ENCOUNTER — Encounter: Payer: Self-pay | Admitting: Neurology

## 2014-01-01 ENCOUNTER — Ambulatory Visit (INDEPENDENT_AMBULATORY_CARE_PROVIDER_SITE_OTHER): Payer: Medicare Other | Admitting: Neurology

## 2014-01-01 VITALS — BP 120/86 | HR 84 | Ht 62.0 in | Wt 211.0 lb

## 2014-01-01 DIAGNOSIS — R110 Nausea: Secondary | ICD-10-CM

## 2014-01-01 DIAGNOSIS — G4441 Drug-induced headache, not elsewhere classified, intractable: Secondary | ICD-10-CM

## 2014-01-01 DIAGNOSIS — G43709 Chronic migraine without aura, not intractable, without status migrainosus: Secondary | ICD-10-CM

## 2014-01-01 DIAGNOSIS — G4440 Drug-induced headache, not elsewhere classified, not intractable: Secondary | ICD-10-CM

## 2014-01-01 DIAGNOSIS — Z79899 Other long term (current) drug therapy: Secondary | ICD-10-CM
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
## 2014-01-01 NOTE — Patient Instructions (Signed)
1.  Follow the sleep instructions 2.  Follow up in 3 months.

## 2014-04-03 ENCOUNTER — Ambulatory Visit: Payer: Medicare Other | Admitting: Neurology

## 2014-04-08 ENCOUNTER — Ambulatory Visit (INDEPENDENT_AMBULATORY_CARE_PROVIDER_SITE_OTHER): Payer: Medicare Other | Admitting: Neurology

## 2014-04-08 ENCOUNTER — Encounter: Payer: Self-pay | Admitting: Neurology

## 2014-04-08 VITALS — BP 136/74 | HR 70 | Temp 97.8°F | Resp 20 | Ht 62.0 in | Wt 208.2 lb

## 2014-04-08 DIAGNOSIS — F418 Other specified anxiety disorders: Secondary | ICD-10-CM

## 2014-04-08 DIAGNOSIS — G44219 Episodic tension-type headache, not intractable: Secondary | ICD-10-CM

## 2014-04-08 DIAGNOSIS — F329 Major depressive disorder, single episode, unspecified: Secondary | ICD-10-CM

## 2014-04-08 DIAGNOSIS — F419 Anxiety disorder, unspecified: Secondary | ICD-10-CM

## 2014-04-08 DIAGNOSIS — F32A Depression, unspecified: Secondary | ICD-10-CM
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
## 2014-04-08 NOTE — Patient Instructions (Signed)
Please follow the instructions of your counselor.  Also please start the sleep exercises provided on the sheet.  Follow up in 3 months.

## 2014-07-08 ENCOUNTER — Ambulatory Visit: Payer: Medicare Other | Admitting: Neurology

## 2014-07-09 ENCOUNTER — Other Ambulatory Visit: Payer: Self-pay | Admitting: Internal Medicine

## 2014-07-10 ENCOUNTER — Telehealth: Payer: Self-pay | Admitting: *Deleted

## 2014-07-10 ENCOUNTER — Other Ambulatory Visit: Payer: Self-pay | Admitting: *Deleted

## 2014-07-10 NOTE — Telephone Encounter (Signed)
Patient called stating here head has been hurting for several days please give her a call back ASAP.

## 2014-07-10 NOTE — Telephone Encounter (Signed)
I tried to contact the patient at the number on snap shot was not able to reach patient note sent to me was for a ASAP without a telephone number

## 2014-07-10 NOTE — Telephone Encounter (Signed)
Patient called stating she is having a really bad headache she says she is under a lot of stress and just cannot cope . She states she has been drinking lots of coffee and smoking Black and Tans . I advised her to stop both and drink water to walk and try to cope . She has Cambia on had and will try that . She was advised to call in am if not better appt was rescheduled for 6/16

## 2014-07-11 ENCOUNTER — Ambulatory Visit (HOSPITAL_COMMUNITY)
Admission: RE | Admit: 2014-07-11 | Discharge: 2014-07-11 | Disposition: A | Discharge status: Home / Self Care | Payer: Medicare Other | Source: Ambulatory Visit | Attending: Internal Medicine | Admitting: Internal Medicine

## 2014-07-11 ENCOUNTER — Other Ambulatory Visit (HOSPITAL_COMMUNITY): Payer: Self-pay | Admitting: Internal Medicine

## 2014-07-11 DIAGNOSIS — R079 Chest pain, unspecified: Secondary | ICD-10-CM | POA: Diagnosis present

## 2014-07-14 ENCOUNTER — Telehealth: Payer: Self-pay | Admitting: *Deleted

## 2014-07-14 ENCOUNTER — Ambulatory Visit: Payer: Medicare Other

## 2014-07-14 NOTE — Telephone Encounter (Signed)
Patient went to her PCP she gave her Septra and prednisone neither have seem to help she may go to the hospital please advise Call back number [phone]

## 2014-07-14 NOTE — Telephone Encounter (Signed)
Patient will call in am if headache is not better she will come in for injection on 07/15/14 she is unable to come in today  For injection

## 2014-07-15 ENCOUNTER — Ambulatory Visit (INDEPENDENT_AMBULATORY_CARE_PROVIDER_SITE_OTHER): Payer: Medicare Other

## 2014-07-15 DIAGNOSIS — G43009 Migraine without aura, not intractable, without status migrainosus: Secondary | ICD-10-CM | POA: Diagnosis not present

## 2014-07-15 MED ORDER — KETOROLAC TROMETHAMINE 60 MG/2ML IM SOLN
60.0000 mg | Freq: Once | INTRAMUSCULAR | Status: AC
  Administered 2014-07-15: 60 mg via INTRAMUSCULAR

## 2014-07-16 ENCOUNTER — Telehealth: Payer: Self-pay | Admitting: Neurology

## 2014-07-16 ENCOUNTER — Encounter: Payer: Self-pay | Admitting: *Deleted

## 2014-07-16 NOTE — Telephone Encounter (Signed)
Pt called in today, she asked that I please tell Susie Thank you, her headache has gotten better. If you have any questions - CB# [phone] / Sherri S.

## 2014-07-16 NOTE — Telephone Encounter (Signed)
I am glad it helped

## 2014-08-22 ENCOUNTER — Ambulatory Visit: Payer: Medicare Other | Admitting: Neurology

## 2014-08-22 ENCOUNTER — Telehealth: Payer: Self-pay | Admitting: Neurology

## 2014-08-22 NOTE — Telephone Encounter (Signed)
Pt cancelled today's appt, stating she has had headache x 3 days and can not make it. She will call Monday if headache has not resolved over the week-end. Today's appt marked as a no show / Sherri S.

## 2014-11-28 ENCOUNTER — Encounter: Payer: Self-pay | Admitting: Neurology

## 2014-11-28 ENCOUNTER — Ambulatory Visit (INDEPENDENT_AMBULATORY_CARE_PROVIDER_SITE_OTHER): Payer: Medicare Other | Admitting: Neurology

## 2014-11-28 DIAGNOSIS — G43019 Migraine without aura, intractable, without status migrainosus: Secondary | ICD-10-CM | POA: Insufficient documentation

## 2014-11-28 DIAGNOSIS — G44211 Episodic tension-type headache, intractable: Secondary | ICD-10-CM | POA: Diagnosis not present

## 2014-11-28 MED ORDER — KETOROLAC TROMETHAMINE 60 MG/2ML IM SOLN
60.0000 mg | Freq: Once | INTRAMUSCULAR | Status: AC
  Administered 2014-11-28: 60 mg via INTRAMUSCULAR

## 2014-11-28 NOTE — Patient Instructions (Signed)
We will give you Toradol 60mg  injection Call on Monday with update Follow up in 3 months
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
## 2014-12-02 ENCOUNTER — Telehealth: Payer: Self-pay

## 2014-12-02 NOTE — Telephone Encounter (Signed)
If it is mild and manageable, I would continue to monitor. However, if she feels like the headache is gradually getting worse, we can prescribe her a prednisone 10mg  taper.  Take 6tabs x1day, then 5tabs x1day, then 4tabs x1day, then 3tabs x1day, then 2tabs x1day, then 1tab x1day, then STOP.

## 2014-12-02 NOTE — Telephone Encounter (Signed)
LMTCB.. MAH 

## 2014-12-02 NOTE — Telephone Encounter (Signed)
Patient called in this morning to give [place] an update on her toradol injection that she received on Friday. She states that her headache went away Friday and has been gradually returning over the past 2 days she states that right now her headaches is just slight but, she can feel it tensing up. She states that she was instructed to call in today and let us know how she is.

## 2014-12-03 MED ORDER — PREDNISONE 10 MG (21) PO TBPK: ORAL_TABLET | ORAL | Status: DC

## 2014-12-03 NOTE — Telephone Encounter (Signed)
Pt called and stated she is under a lot of stress and is having headaches/Dawn CB# [phone]

## 2014-12-03 NOTE — Telephone Encounter (Signed)
Spoke with pt. Patient advised to start Prednisone Taper Pak. Will call back and let us know how she is feeling. Patient advised that Rx has been sent to the pharmacy. [facility]

## 2014-12-30 ENCOUNTER — Telehealth: Payer: Self-pay | Admitting: *Deleted

## 2014-12-30 ENCOUNTER — Telehealth: Payer: Self-pay | Admitting: Neurology

## 2014-12-30 ENCOUNTER — Ambulatory Visit (INDEPENDENT_AMBULATORY_CARE_PROVIDER_SITE_OTHER): Payer: Medicare Other | Admitting: *Deleted

## 2014-12-30 DIAGNOSIS — G43009 Migraine without aura, not intractable, without status migrainosus: Secondary | ICD-10-CM | POA: Diagnosis not present

## 2014-12-30 DIAGNOSIS — G44211 Episodic tension-type headache, intractable: Secondary | ICD-10-CM | POA: Diagnosis not present

## 2014-12-30 MED ORDER — METOCLOPRAMIDE HCL 5 MG/ML IJ SOLN
10.0000 mg | Freq: Once | INTRAVENOUS | Status: AC
  Administered 2014-12-30: 10 mg via INTRAVENOUS

## 2014-12-30 MED ORDER — KETOROLAC TROMETHAMINE 60 MG/2ML IM SOLN
60.0000 mg | Freq: Once | INTRAMUSCULAR | Status: AC
  Administered 2014-12-30: 60 mg via INTRAMUSCULAR

## 2014-12-30 NOTE — Telephone Encounter (Signed)
Patient given information.  She is going to try to get a PCP at [facility].

## 2014-12-30 NOTE — Telephone Encounter (Signed)
Patient will be coming in this morning.

## 2014-12-30 NOTE — Telephone Encounter (Signed)
Ok for headache cocktail?

## 2014-12-30 NOTE — Telephone Encounter (Signed)
Yes.  Toradol 60mg Lenard Galloway 25mg /Reglan 10mg .  She should have somebody to drive her because the cocktail can cause drowsiness.  Otherwise, she will need to remain in the office for a bit to make sure she is feeling okay to drive.

## 2014-12-30 NOTE — Telephone Encounter (Signed)
Patient wanted to know if she should still be having nausea from her PTSD.  She would like to see someone different than who she has seen in the past.  (psych?)

## 2014-12-30 NOTE — Progress Notes (Signed)
Patient in for headache cocktail.

## 2014-12-30 NOTE — Telephone Encounter (Signed)
Her nausea is separate from her headaches.  She was seeing gastroenterology for nausea.  I'm not sure who she was seeing for psychiatry.  I would have her ask her PCP for an appropriate referral.

## 2014-12-30 NOTE — Telephone Encounter (Signed)
Pt [phone]//to inform that she if still having ha's/ wants to come in for a shot

## 2015-01-01 ENCOUNTER — Other Ambulatory Visit: Payer: Self-pay | Admitting: *Deleted

## 2015-01-01 MED ORDER — SUMATRIPTAN SUCCINATE 6 MG/0.5ML ~~LOC~~ SOAJ
0.5000 mL | Freq: Once | SUBCUTANEOUS | Status: DC
Start: 2015-01-01 — End: 2018-05-18

## 2015-01-01 NOTE — Telephone Encounter (Signed)
PT called and said she is still having headaches even after the shot yesterday/Dawn CB# [phone]

## 2015-01-01 NOTE — Telephone Encounter (Signed)
We can prescribe her sumatriptan 6mg  injection.  She may repeat injection after 2 hours if needed.  She should not exceed two injections in 24 hours.  Otherwise, she will need to go to the ED for possible IV medication

## 2015-01-01 NOTE — Telephone Encounter (Signed)
Please advise 

## 2015-01-01 NOTE — Telephone Encounter (Signed)
Patient agreed to try the injections.  Rx sent.

## 2015-01-05 ENCOUNTER — Telehealth: Payer: Self-pay | Admitting: Neurology

## 2015-01-05 NOTE — Telephone Encounter (Signed)
Rx called in to patient's pharmacy.

## 2015-01-05 NOTE — Telephone Encounter (Signed)
Please review

## 2015-01-05 NOTE — Telephone Encounter (Signed)
PT called and said that Dr Tomi Likens wrote a prescription for her injections but she wants a prescription written for the needles/Dawn CB# [phone]

## 2015-01-05 NOTE — Telephone Encounter (Signed)
Okay.  Please send for needles for sumatriptan 6mg 

## 2015-01-07 ENCOUNTER — Encounter (HOSPITAL_COMMUNITY): Payer: Self-pay | Admitting: Emergency Medicine

## 2015-01-07 ENCOUNTER — Emergency Department (HOSPITAL_COMMUNITY)
Admission: EM | Admit: 2015-01-07 | Discharge: 2015-01-07 | Disposition: A | Discharge status: Home / Self Care | Payer: Medicare Other | Attending: Emergency Medicine | Admitting: Emergency Medicine

## 2015-01-07 DIAGNOSIS — Z86010 Personal history of colonic polyps: Secondary | ICD-10-CM | POA: Insufficient documentation

## 2015-01-07 DIAGNOSIS — F419 Anxiety disorder, unspecified: Secondary | ICD-10-CM | POA: Insufficient documentation

## 2015-01-07 DIAGNOSIS — F329 Major depressive disorder, single episode, unspecified: Secondary | ICD-10-CM | POA: Diagnosis not present

## 2015-01-07 DIAGNOSIS — Z720 Tobacco use: Secondary | ICD-10-CM | POA: Diagnosis not present

## 2015-01-07 DIAGNOSIS — G43909 Migraine, unspecified, not intractable, without status migrainosus: Secondary | ICD-10-CM | POA: Diagnosis present

## 2015-01-07 DIAGNOSIS — M25511 Pain in right shoulder: Secondary | ICD-10-CM | POA: Diagnosis not present

## 2015-01-07 DIAGNOSIS — Z8719 Personal history of other diseases of the digestive system: Secondary | ICD-10-CM | POA: Insufficient documentation

## 2015-01-07 DIAGNOSIS — Z87828 Personal history of other (healed) physical injury and trauma: Secondary | ICD-10-CM | POA: Insufficient documentation

## 2015-01-07 DIAGNOSIS — M1990 Unspecified osteoarthritis, unspecified site: Secondary | ICD-10-CM | POA: Insufficient documentation

## 2015-01-07 DIAGNOSIS — I10 Essential (primary) hypertension: Secondary | ICD-10-CM | POA: Diagnosis not present

## 2015-01-07 DIAGNOSIS — Z862 Personal history of diseases of the blood and blood-forming organs and certain disorders involving the immune mechanism: Secondary | ICD-10-CM | POA: Insufficient documentation

## 2015-01-07 DIAGNOSIS — G43809 Other migraine, not intractable, without status migrainosus: Secondary | ICD-10-CM

## 2015-01-07 DIAGNOSIS — R109 Unspecified abdominal pain: Secondary | ICD-10-CM | POA: Diagnosis not present

## 2015-01-07 LAB — CBC WITH DIFFERENTIAL/PLATELET
BASOS PCT: 1 %
Basophils Absolute: 0.1 10*3/uL (ref 0.0–0.1)
Eosinophils Absolute: 0.1 10*3/uL (ref 0.0–0.7)
Eosinophils Relative: 4 %
HCT: 35.2 % — ABNORMAL LOW (ref 36.0–46.0)
Hemoglobin: 11.6 g/dL — ABNORMAL LOW (ref 12.0–15.0)
LYMPHS PCT: 36 %
Lymphs Abs: 1.4 10*3/uL (ref 0.7–4.0)
MCH: 29 pg (ref 26.0–34.0)
MCHC: 33 g/dL (ref 30.0–36.0)
MCV: 88 fL (ref 78.0–100.0)
MONO ABS: 0.4 10*3/uL (ref 0.1–1.0)
Monocytes Relative: 10 %
Neutro Abs: 2 10*3/uL (ref 1.7–7.7)
Neutrophils Relative %: 49 %
PLATELETS: 382 10*3/uL (ref 150–400)
RBC: 4 MIL/uL (ref 3.87–5.11)
RDW: 16 % — AB (ref 11.5–15.5)
WBC: 4 10*3/uL (ref 4.0–10.5)

## 2015-01-07 LAB — BASIC METABOLIC PANEL
ANION GAP: 4 — AB (ref 5–15)
BUN: 8 mg/dL (ref 6–20)
CALCIUM: 8.4 mg/dL — AB (ref 8.9–10.3)
CO2: 25 mmol/L (ref 22–32)
Chloride: 108 mmol/L (ref 101–111)
Creatinine, Ser: 0.73 mg/dL (ref 0.44–1.00)
GFR calc Af Amer: >60 mL/min (ref >60)
GLUCOSE: 97 mg/dL (ref 65–99)
Potassium: 3.9 mmol/L (ref 3.5–5.1)
Sodium: 137 mmol/L (ref 135–145)

## 2015-01-07 MED ORDER — DIPHENHYDRAMINE HCL 50 MG/ML IJ SOLN
25.0000 mg | Freq: Once | INTRAMUSCULAR | Status: AC
  Administered 2015-01-07: 25 mg via INTRAVENOUS
  Filled 2015-01-07: qty 1

## 2015-01-07 MED ORDER — PROCHLORPERAZINE EDISYLATE 5 MG/ML IJ SOLN
10.0000 mg | Freq: Four times a day (QID) | INTRAMUSCULAR | Status: DC | PRN
  Administered 2015-01-07: 10 mg via INTRAVENOUS
  Filled 2015-01-07: qty 2

## 2015-01-07 MED ORDER — KETOROLAC TROMETHAMINE 30 MG/ML IJ SOLN
30.0000 mg | Freq: Once | INTRAMUSCULAR | Status: AC
  Administered 2015-01-07: 30 mg via INTRAVENOUS
  Filled 2015-01-07: qty 1

## 2015-01-07 MED ORDER — ONDANSETRON 4 MG PO TBDP: 4.0000 mg | ORAL_TABLET | Freq: Three times a day (TID) | ORAL | Status: DC | PRN

## 2015-01-07 MED ORDER — SODIUM CHLORIDE 0.9 % IV BOLUS (SEPSIS)
1000.0000 mL | Freq: Once | INTRAVENOUS | Status: AC
  Administered 2015-01-07: 1000 mL via INTRAVENOUS

## 2015-01-07 NOTE — Discharge Instructions (Signed)
Continue taking your migraine medications as prescribed. Follow-up with her primary care provider or neurologist in the next 3-4 days. Return to the emergency department if symptoms worsen or new onset of visual changes, numbness, tingling, weakness, vomiting, fever, neck stiffness, seizures, syncope.

## 2015-01-07 NOTE — ED Notes (Signed)
Patient is alert and oriented x3.  She was given DC instructions and follow up visit instructions.  Patient gave verbal understanding. She was DC ambulatory under her own power to home.  V/S stable.  He was not showing any signs of distress on DC 
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
## 2015-01-07 NOTE — ED Notes (Signed)
Patient c/o headache x6-7 days. Patient c/o neck pain, states she is nauseated. Patient MD called in Rx for injection of Sumatriptan, states she was not given instructions on how to use this medication. Patient states she has taken her home nausea medication without relief. Patient states she is "in post concussion going on 5 years". Patient A&O, NAD noted, using phone.

## 2015-01-09 ENCOUNTER — Telehealth: Payer: Self-pay | Admitting: Neurology

## 2015-01-09 DIAGNOSIS — G43019 Migraine without aura, intractable, without status migrainosus: Secondary | ICD-10-CM

## 2015-01-09 MED ORDER — SUMATRIPTAN 5 MG/ACT NA SOLN: 1.0000 | Freq: Once | NASAL | Status: DC

## 2015-01-09 NOTE — Telephone Encounter (Signed)
Pt called about new med/injection/couldn't give med name/ requests something else/call back @ [phone]

## 2015-01-09 NOTE — Telephone Encounter (Signed)
Returned call to patient. Patient cannot use the injectable sumatriptan 6 mg/0.5 mL due to fear of needles. Pt presented to ER, due to headache because she was so afraid of injection.  Pt states when medication was discussed she thought it was done w/o a needle. Pt states she has been researching Sumavel DosePro and thinks that would be a better option for her. Please Advise.

## 2015-01-09 NOTE — Telephone Encounter (Signed)
Spoke with patient. Has never tried nasal spray! Would love to try it. RX sent in.

## 2015-01-09 NOTE — Telephone Encounter (Signed)
In my opinion and experience, the sumavel hurts worse than the imitrex injection.  However, given fear of needles, has she tried imitrex NS?

## 2015-01-28 ENCOUNTER — Other Ambulatory Visit: Payer: Self-pay | Admitting: Neurology

## 2015-03-10 ENCOUNTER — Ambulatory Visit: Payer: Medicare Other | Admitting: Neurology

## 2015-03-11 ENCOUNTER — Telehealth: Payer: Self-pay | Admitting: *Deleted

## 2015-03-11 DIAGNOSIS — G43019 Migraine without aura, intractable, without status migrainosus: Secondary | ICD-10-CM

## 2015-03-11 MED ORDER — SUMATRIPTAN 5 MG/ACT NA SOLN: 1.0000 | Freq: Once | NASAL | Status: DC

## 2015-03-11 NOTE — Addendum Note (Signed)
Addended by: Gerda Diss A on: 03/11/2015 01:35 PM   Modules accepted: Orders

## 2015-03-11 NOTE — Telephone Encounter (Signed)
Spoke with patient. RX sent in.

## 2015-03-11 NOTE — Telephone Encounter (Signed)
Patient missed appointment yesterday because her cymbalta made her suicidal.  She came in to r/s and has been put on the waiting list.  She would like a refill on her imitrex nasal spray.

## 2015-04-20 ENCOUNTER — Ambulatory Visit: Payer: Medicare Other | Admitting: Neurology

## 2015-05-20 ENCOUNTER — Telehealth: Payer: Self-pay | Admitting: Neurology

## 2015-05-20 NOTE — Telephone Encounter (Signed)
Left message on machine for pt to return call to the office.  

## 2015-05-20 NOTE — Telephone Encounter (Signed)
Pt needs to talk to someone about her having a headache for 3 days now please call [phone]

## 2015-05-20 NOTE — Telephone Encounter (Signed)
Pt called to complain of headache. Today is her third day with headache. Pt states she is also suffering from allergies this year, first time it's ever been an issue. Pt has taken Onzetra (2 doses), Cambia, OTC medication, with little to no relief. Pt has also taken Claritin, which did help her as yesterday she felt like she couldn't even talk. Please advise.

## 2015-05-20 NOTE — Telephone Encounter (Signed)
If she has a driver, she can come in for a headache cocktail (Toradol 60mg Lenard Galloway 25mg /Reglan 10mg ).  Otherwise, she can come in for just a Toradol 60mg  injection.  Has the [place] ever helped?  It looks like she was prescribed sumatriptan nasal spray as well.  Did that help?

## 2015-05-22 ENCOUNTER — Other Ambulatory Visit: Payer: Self-pay | Admitting: Neurology

## 2015-05-22 ENCOUNTER — Other Ambulatory Visit: Payer: Self-pay

## 2015-05-22 ENCOUNTER — Telehealth: Payer: Self-pay

## 2015-05-22 NOTE — Telephone Encounter (Signed)
Sumatriptan nasal spray refill requested. Per last office note- patient to remain on medication. Refill approved and sent to patient's pharmacy.

## 2015-05-22 NOTE — Telephone Encounter (Signed)
Pt called to update medication list. PCP put pt on fexofenadine for allergies. Medication list updated.

## 2015-06-22 ENCOUNTER — Encounter: Payer: Medicare Other | Admitting: Obstetrics & Gynecology

## 2015-07-15 ENCOUNTER — Ambulatory Visit: Payer: Medicare Other | Admitting: Neurology

## 2015-08-08 ENCOUNTER — Other Ambulatory Visit: Payer: Self-pay | Admitting: Neurology

## 2015-08-10 ENCOUNTER — Telehealth: Payer: Self-pay | Admitting: Neurology

## 2015-08-10 NOTE — Telephone Encounter (Signed)
Pt will have PCP place referral since she has not been since in 8 months.

## 2015-08-10 NOTE — Telephone Encounter (Signed)
Leah Fox 05-Dec-2065. She would like a refill on her Sumatriptan. She uses [facility] She also would like a Referral to [facility].  Her number is [phone]. Thank you

## 2015-08-10 NOTE — Telephone Encounter (Signed)
RX was sent in. (phoned in, does not appear to have gone through last week) Attempted to reach pt to find out about what kind of referral she is wanting. And to let her know she needs to schedule and keep a follow up appointment w/ provider for continued refills. Line disconnected x 2 w/o a vm answering.

## 2015-08-30 IMAGING — CR DG CHEST 2V
2 series · 2 of 2 positions shown · non-contrast
Comparison: Portable chest x-ray November 19, 2013

CLINICAL DATA: Burning and itching over the chest for the past 4
days with pain radiating to the jaw all, current smoker.

EXAM:
CHEST  2 VIEW

[w chest pa]
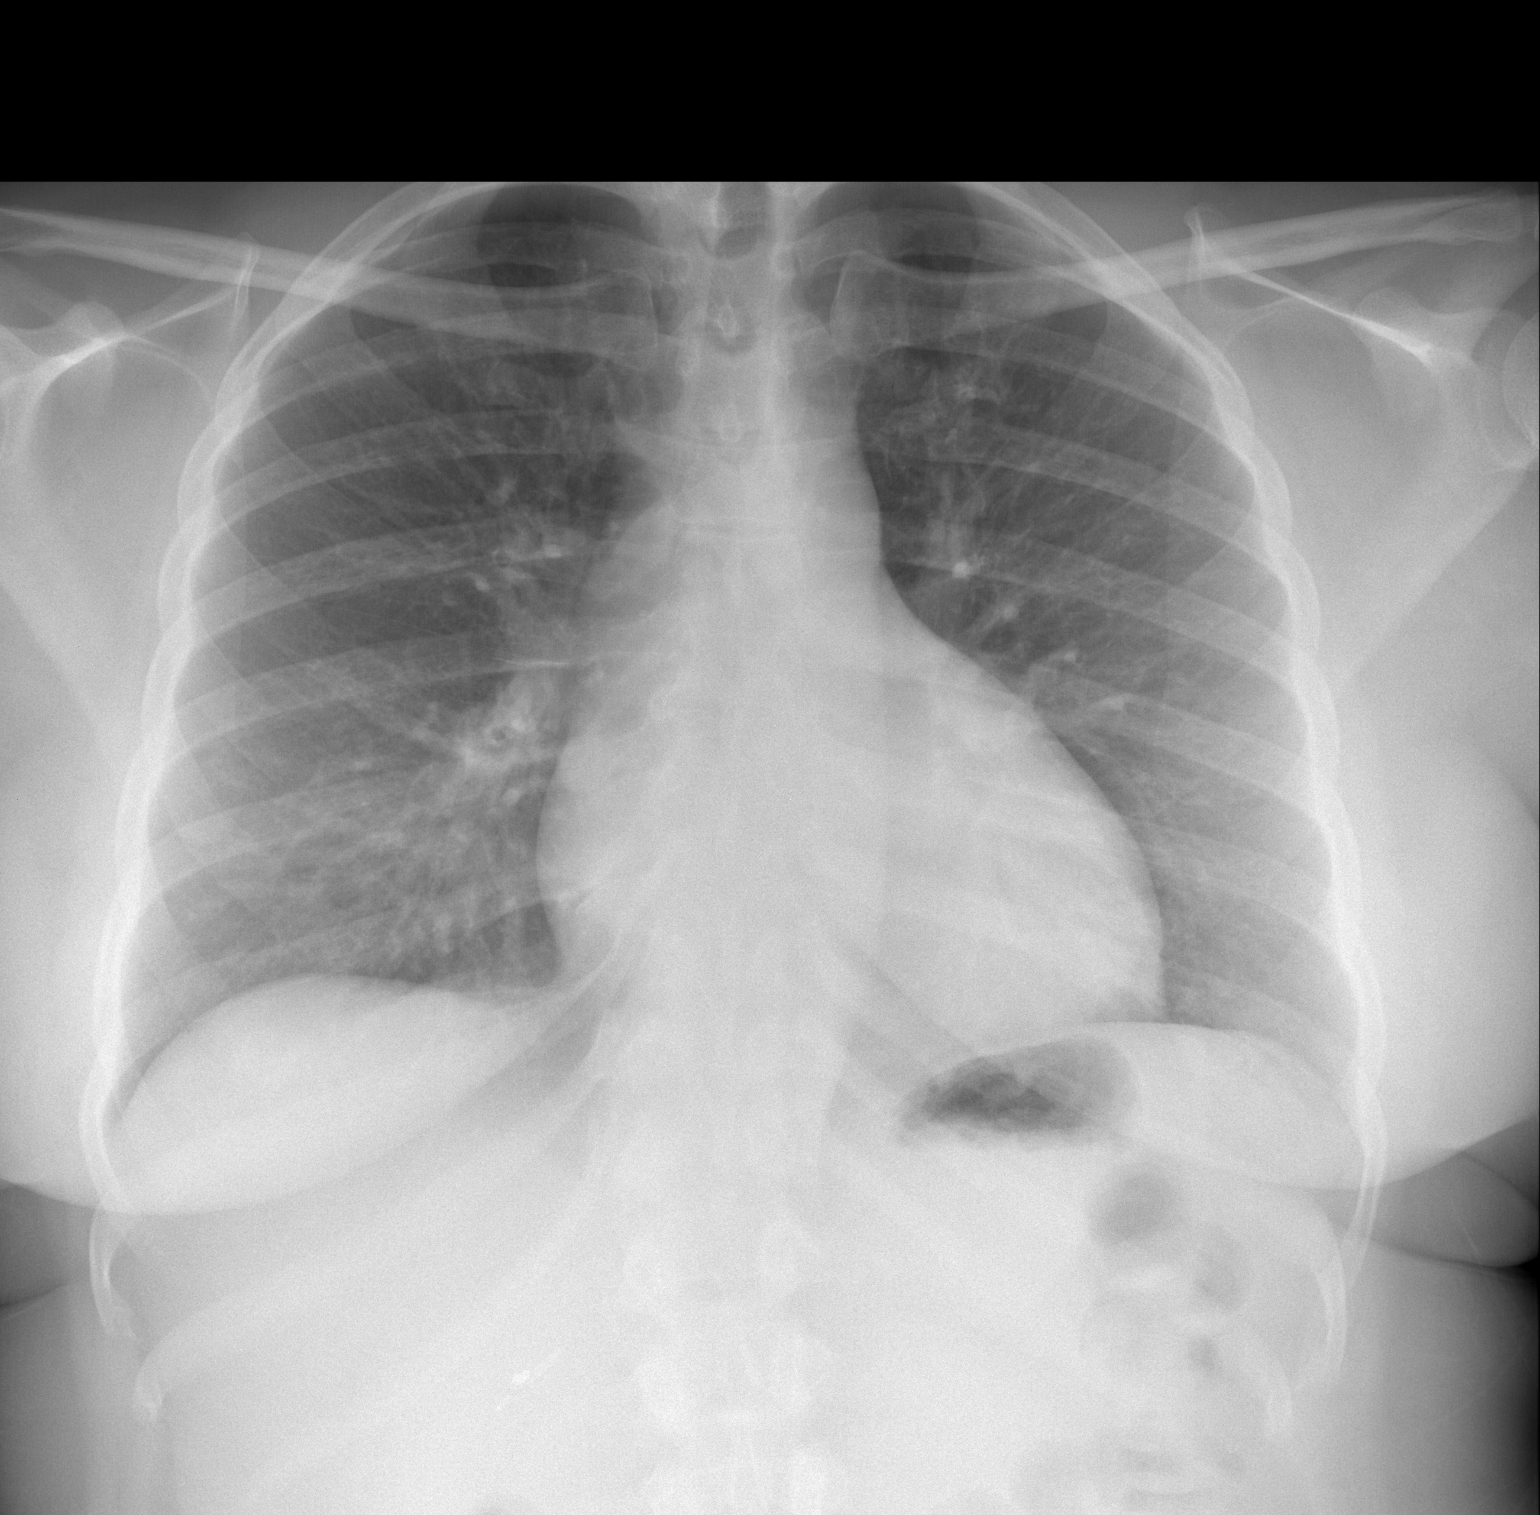

[w chest lat]
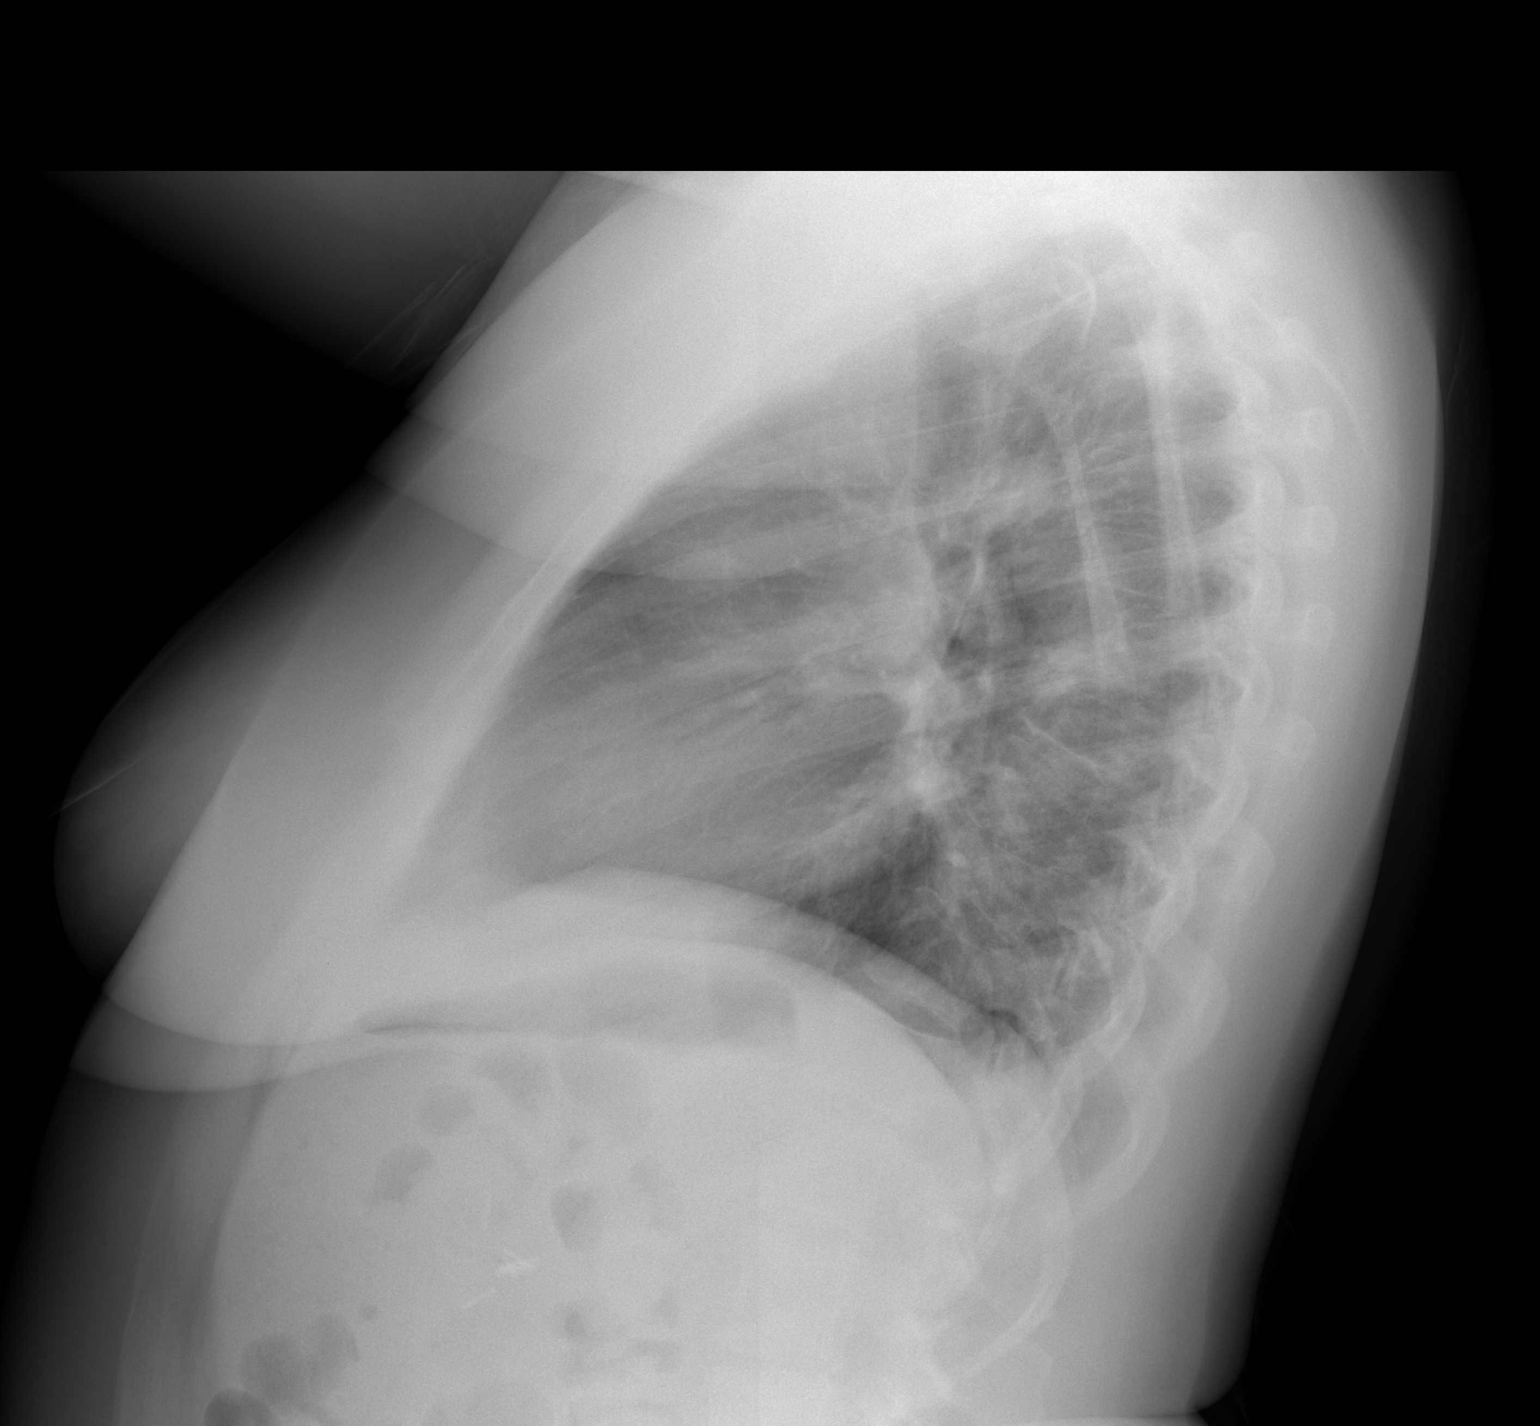

[2 of 2 positions shown; findings below may reference images not displayed]

FINDINGS: The lungs are adequately inflated and clear. The heart and pulmonary
vascularity are normal. The mediastinum is normal in width. There is
no pleural effusion. The bony thorax is unremarkable.
IMPRESSION: There is no active cardiopulmonary disease.

## 2015-09-02 ENCOUNTER — Ambulatory Visit: Payer: Medicare Other | Admitting: Neurology

## 2015-09-02 ENCOUNTER — Encounter: Payer: Self-pay | Admitting: Neurology

## 2015-09-03 ENCOUNTER — Telehealth: Payer: Self-pay | Admitting: Neurology

## 2015-09-03 NOTE — Telephone Encounter (Signed)
Patient dismissed from [facility] by Metta Clines, DO , effective June 14,2017. Dismissal letter sent out by certified / registered mail 09/03/15 [facility].

## 2015-09-11 NOTE — Telephone Encounter (Signed)
Received signed domestic return receipt verifying delivery of certified letter on September 09, 2015. Article number [id number]

## 2015-10-12 ENCOUNTER — Other Ambulatory Visit: Payer: Self-pay | Admitting: Neurology

## 2016-01-15 ENCOUNTER — Other Ambulatory Visit: Payer: Self-pay | Admitting: Neurology

## 2016-07-29 ENCOUNTER — Emergency Department (HOSPITAL_COMMUNITY)
Admission: EM | Admit: 2016-07-29 | Discharge: 2016-07-29 | Disposition: A | Discharge status: Home / Self Care | Payer: Medicare Other | Attending: Emergency Medicine | Admitting: Emergency Medicine

## 2016-07-29 ENCOUNTER — Emergency Department (HOSPITAL_COMMUNITY): Payer: Medicare Other

## 2016-07-29 ENCOUNTER — Encounter (HOSPITAL_COMMUNITY): Payer: Self-pay

## 2016-07-29 DIAGNOSIS — Z79899 Other long term (current) drug therapy: Secondary | ICD-10-CM | POA: Diagnosis not present

## 2016-07-29 DIAGNOSIS — S7012XA Contusion of left thigh, initial encounter: Secondary | ICD-10-CM | POA: Insufficient documentation

## 2016-07-29 DIAGNOSIS — Y999 Unspecified external cause status: Secondary | ICD-10-CM | POA: Diagnosis not present

## 2016-07-29 DIAGNOSIS — S39012A Strain of muscle, fascia and tendon of lower back, initial encounter: Secondary | ICD-10-CM | POA: Insufficient documentation

## 2016-07-29 DIAGNOSIS — M25562 Pain in left knee: Secondary | ICD-10-CM | POA: Diagnosis not present

## 2016-07-29 DIAGNOSIS — Y92410 Unspecified street and highway as the place of occurrence of the external cause: Secondary | ICD-10-CM | POA: Insufficient documentation

## 2016-07-29 DIAGNOSIS — S161XXA Strain of muscle, fascia and tendon at neck level, initial encounter: Secondary | ICD-10-CM | POA: Insufficient documentation

## 2016-07-29 DIAGNOSIS — Y939 Activity, unspecified: Secondary | ICD-10-CM | POA: Diagnosis not present

## 2016-07-29 DIAGNOSIS — F17210 Nicotine dependence, cigarettes, uncomplicated: Secondary | ICD-10-CM | POA: Diagnosis not present

## 2016-07-29 DIAGNOSIS — S199XXA Unspecified injury of neck, initial encounter: Secondary | ICD-10-CM | POA: Diagnosis present

## 2016-07-29 DIAGNOSIS — I10 Essential (primary) hypertension: Secondary | ICD-10-CM | POA: Insufficient documentation

## 2016-07-29 MED ORDER — TRAMADOL HCL 50 MG PO TABS: 50.0000 mg | ORAL_TABLET | Freq: Four times a day (QID) | ORAL | 0 refills | Status: DC | PRN
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
## 2016-07-29 NOTE — ED Notes (Signed)
Patient verbalizes understanding of discharge instructions, prescriptions, home care and follow up care. Patient out of department at this time. 
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
## 2016-07-29 NOTE — Discharge Instructions (Signed)
Take ibuprofen, naproxen or acetaminophen for less severe pain.

## 2018-01-05 ENCOUNTER — Emergency Department (HOSPITAL_COMMUNITY)
Admission: EM | Admit: 2018-01-05 | Discharge: 2018-01-05 | Disposition: A | Discharge status: Home / Self Care | Payer: No Typology Code available for payment source | Attending: Emergency Medicine | Admitting: Emergency Medicine

## 2018-01-05 ENCOUNTER — Emergency Department (HOSPITAL_COMMUNITY): Payer: No Typology Code available for payment source

## 2018-01-05 ENCOUNTER — Encounter (HOSPITAL_COMMUNITY): Payer: Self-pay | Admitting: Emergency Medicine

## 2018-01-05 DIAGNOSIS — I10 Essential (primary) hypertension: Secondary | ICD-10-CM | POA: Diagnosis not present

## 2018-01-05 DIAGNOSIS — M542 Cervicalgia: Secondary | ICD-10-CM | POA: Insufficient documentation

## 2018-01-05 DIAGNOSIS — Z79899 Other long term (current) drug therapy: Secondary | ICD-10-CM | POA: Insufficient documentation

## 2018-01-05 DIAGNOSIS — R51 Headache: Secondary | ICD-10-CM | POA: Insufficient documentation

## 2018-01-05 DIAGNOSIS — M545 Low back pain, unspecified: Secondary | ICD-10-CM

## 2018-01-05 DIAGNOSIS — Y939 Activity, unspecified: Secondary | ICD-10-CM | POA: Diagnosis not present

## 2018-01-05 DIAGNOSIS — F17210 Nicotine dependence, cigarettes, uncomplicated: Secondary | ICD-10-CM | POA: Insufficient documentation

## 2018-01-05 DIAGNOSIS — Y92410 Unspecified street and highway as the place of occurrence of the external cause: Secondary | ICD-10-CM | POA: Insufficient documentation

## 2018-01-05 DIAGNOSIS — R519 Headache, unspecified: Secondary | ICD-10-CM

## 2018-01-05 DIAGNOSIS — Y998 Other external cause status: Secondary | ICD-10-CM | POA: Diagnosis not present

## 2018-01-05 DIAGNOSIS — M25562 Pain in left knee: Secondary | ICD-10-CM | POA: Diagnosis not present

## 2018-01-05 MED ORDER — METHOCARBAMOL 500 MG PO TABS
500.0000 mg | ORAL_TABLET | Freq: Once | ORAL | Status: AC
  Administered 2018-01-05: 500 mg via ORAL
  Filled 2018-01-05: qty 1

## 2018-01-05 MED ORDER — NAPROXEN 500 MG PO TABS: 500.0000 mg | ORAL_TABLET | Freq: Two times a day (BID) | ORAL | 0 refills | Status: DC

## 2018-01-05 MED ORDER — DIPHENHYDRAMINE HCL 50 MG/ML IJ SOLN
25.0000 mg | Freq: Once | INTRAMUSCULAR | Status: AC
  Administered 2018-01-05: 25 mg via INTRAMUSCULAR
  Filled 2018-01-05: qty 1

## 2018-01-05 MED ORDER — METOCLOPRAMIDE HCL 10 MG PO TABS
10.0000 mg | ORAL_TABLET | Freq: Once | ORAL | Status: AC
  Administered 2018-01-05: 10 mg via ORAL
  Filled 2018-01-05: qty 1

## 2018-01-05 MED ORDER — METOCLOPRAMIDE HCL 5 MG/ML IJ SOLN
10.0000 mg | Freq: Once | INTRAMUSCULAR | Status: DC
  Filled 2018-01-05: qty 2

## 2018-01-05 MED ORDER — METHOCARBAMOL 500 MG PO TABS: 500.0000 mg | ORAL_TABLET | Freq: Two times a day (BID) | ORAL | 0 refills | Status: DC

## 2018-01-05 NOTE — ED Notes (Signed)
Bed: WTR6 Expected date:  Expected time:  Means of arrival:  Comments: 

## 2018-01-05 NOTE — ED Notes (Signed)
Bed: WTR7 Expected date:  Expected time:  Means of arrival:  Comments: 

## 2018-01-05 NOTE — Discharge Instructions (Addendum)
The pain you're experiencing is likely due to muscle strain, you may take Naprosyn and Robaxin as needed for pain management. Do not combine with any pain reliever other than tylenol. The muscle soreness should improve over the next week. Follow up with your family doctor in the next week for a recheck if you are still having symptoms. Return to ED if pain is worsening, you develop weakness or numbness of extremities, or new or concerning symptoms develop.
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
## 2018-01-05 NOTE — ED Notes (Signed)
Patient verbalized understanding of discharge instructions, no questions. Patient ambulated out of ED with steady gait in no distress.  
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
## 2018-02-08 ENCOUNTER — Emergency Department (HOSPITAL_COMMUNITY)
Admission: EM | Admit: 2018-02-08 | Discharge: 2018-02-09 | Disposition: A | Discharge status: Home / Self Care | Payer: Medicare Other | Attending: Emergency Medicine | Admitting: Emergency Medicine

## 2018-02-08 ENCOUNTER — Encounter (HOSPITAL_COMMUNITY): Payer: Self-pay | Admitting: Emergency Medicine

## 2018-02-08 DIAGNOSIS — R112 Nausea with vomiting, unspecified: Secondary | ICD-10-CM | POA: Insufficient documentation

## 2018-02-08 DIAGNOSIS — Z79899 Other long term (current) drug therapy: Secondary | ICD-10-CM | POA: Diagnosis not present

## 2018-02-08 DIAGNOSIS — K5900 Constipation, unspecified: Secondary | ICD-10-CM | POA: Diagnosis not present

## 2018-02-08 DIAGNOSIS — R1032 Left lower quadrant pain: Secondary | ICD-10-CM | POA: Insufficient documentation

## 2018-02-08 LAB — COMPREHENSIVE METABOLIC PANEL WITH GFR
ALT: 17 U/L (ref 0–44)
AST: 17 U/L (ref 15–41)
Albumin: 4.3 g/dL (ref 3.5–5.0)
Alkaline Phosphatase: 73 U/L (ref 38–126)
Anion gap: 8 (ref 5–15)
BUN: 8 mg/dL (ref 6–20)
CO2: 27 mmol/L (ref 22–32)
Calcium: 8.9 mg/dL (ref 8.9–10.3)
Chloride: 103 mmol/L (ref 98–111)
Creatinine, Ser: 0.63 mg/dL (ref 0.44–1.00)
GFR calc Af Amer: >60 mL/min
GFR calc non Af Amer: >60 mL/min
Glucose, Bld: 97 mg/dL (ref 70–99)
Potassium: 3.4 mmol/L — ABNORMAL LOW (ref 3.5–5.1)
Sodium: 138 mmol/L (ref 135–145)
Total Bilirubin: 0.6 mg/dL (ref 0.3–1.2)
Total Protein: 8.7 g/dL — ABNORMAL HIGH (ref 6.5–8.1)

## 2018-02-08 LAB — CBC
HCT: 35 % — ABNORMAL LOW (ref 36.0–46.0)
Hemoglobin: 10.6 g/dL — ABNORMAL LOW (ref 12.0–15.0)
MCH: 26.4 pg (ref 26.0–34.0)
MCHC: 30.3 g/dL (ref 30.0–36.0)
MCV: 87.3 fL (ref 80.0–100.0)
Platelets: 408 K/uL — ABNORMAL HIGH (ref 150–400)
RBC: 4.01 MIL/uL (ref 3.87–5.11)
RDW: 15.9 % — ABNORMAL HIGH (ref 11.5–15.5)
WBC: 6.4 K/uL (ref 4.0–10.5)
nRBC: 0 % (ref 0.0–0.2)

## 2018-02-08 LAB — URINALYSIS, ROUTINE W REFLEX MICROSCOPIC
BACTERIA UA: NONE SEEN
Glucose, UA: NEGATIVE mg/dL
Ketones, ur: 20 mg/dL — AB
Leukocytes, UA: NEGATIVE
NITRITE: NEGATIVE
PROTEIN: 100 mg/dL — AB
SPECIFIC GRAVITY, URINE: 1.036 — AB (ref 1.005–1.030)
pH: 5 (ref 5.0–8.0)

## 2018-02-08 LAB — PREGNANCY, URINE: Preg Test, Ur: NEGATIVE

## 2018-02-08 LAB — LIPASE, BLOOD: Lipase: 27 U/L (ref 11–51)

## 2018-02-08 MED ORDER — IOPAMIDOL (ISOVUE-300) INJECTION 61%
INTRAVENOUS | Status: AC
  Filled 2018-02-08: qty 100

## 2018-02-08 MED ORDER — FENTANYL CITRATE (PF) 100 MCG/2ML IJ SOLN
100.0000 ug | Freq: Once | INTRAMUSCULAR | Status: AC
  Administered 2018-02-09: 100 ug via INTRAVENOUS
  Filled 2018-02-08: qty 2

## 2018-02-08 MED ORDER — SODIUM CHLORIDE (PF) 0.9 % IJ SOLN
INTRAMUSCULAR | Status: AC
  Filled 2018-02-08: qty 50
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
## 2018-02-08 NOTE — ED Triage Notes (Signed)
Pt from home with c/o abdominal pain and constipation that began yesterday. Pt states she is undergoing lots of life stress. Pt reported 1 episode of emesis at 0300. Pt states she took laxitives and then magnesium citrate which resolved her constipation. Pt reports she now has lower left abdominal pain.

## 2018-02-09 ENCOUNTER — Other Ambulatory Visit: Payer: Self-pay

## 2018-02-09 ENCOUNTER — Encounter (HOSPITAL_COMMUNITY): Payer: Self-pay | Admitting: *Deleted

## 2018-02-09 ENCOUNTER — Emergency Department (HOSPITAL_COMMUNITY): Payer: Medicare Other

## 2018-02-09 DIAGNOSIS — R1032 Left lower quadrant pain: Secondary | ICD-10-CM | POA: Diagnosis not present

## 2018-02-09 MED ORDER — TRAMADOL HCL 50 MG PO TABS: 50.0000 mg | ORAL_TABLET | Freq: Four times a day (QID) | ORAL | 0 refills | Status: DC | PRN

## 2018-02-09 MED ORDER — POLYETHYLENE GLYCOL 3350 17 G PO PACK: PACK | ORAL | 0 refills | Status: DC

## 2018-02-09 MED ORDER — IOPAMIDOL (ISOVUE-300) INJECTION 61%
100.0000 mL | Freq: Once | INTRAVENOUS | Status: AC | PRN
  Administered 2018-02-09: 100 mL via INTRAVENOUS

## 2018-02-09 MED ORDER — DOCUSATE SODIUM 250 MG PO CAPS: 250.0000 mg | ORAL_CAPSULE | Freq: Every day | ORAL | 0 refills | Status: DC

## 2018-02-09 NOTE — Discharge Instructions (Addendum)
Return to the emergency department if you develop a high fever, have severe pain or new symptoms or concern.

## 2018-05-18 ENCOUNTER — Emergency Department (HOSPITAL_COMMUNITY): Payer: Medicare Other

## 2018-05-18 ENCOUNTER — Telehealth: Payer: Self-pay | Admitting: *Deleted

## 2018-05-18 ENCOUNTER — Emergency Department (HOSPITAL_COMMUNITY)
Admission: EM | Admit: 2018-05-18 | Discharge: 2018-05-18 | Disposition: A | Discharge status: Home / Self Care | Payer: Medicare Other | Attending: Emergency Medicine | Admitting: Emergency Medicine

## 2018-05-18 ENCOUNTER — Encounter (HOSPITAL_COMMUNITY): Payer: Self-pay

## 2018-05-18 ENCOUNTER — Other Ambulatory Visit: Payer: Self-pay

## 2018-05-18 DIAGNOSIS — R0789 Other chest pain: Secondary | ICD-10-CM | POA: Diagnosis not present

## 2018-05-18 DIAGNOSIS — R350 Frequency of micturition: Secondary | ICD-10-CM | POA: Diagnosis not present

## 2018-05-18 DIAGNOSIS — Z79899 Other long term (current) drug therapy: Secondary | ICD-10-CM | POA: Diagnosis not present

## 2018-05-18 DIAGNOSIS — Z87891 Personal history of nicotine dependence: Secondary | ICD-10-CM | POA: Insufficient documentation

## 2018-05-18 DIAGNOSIS — I10 Essential (primary) hypertension: Secondary | ICD-10-CM | POA: Insufficient documentation

## 2018-05-18 LAB — CBC WITH DIFFERENTIAL/PLATELET
Abs Immature Granulocytes: 0.02 10*3/uL (ref 0.00–0.07)
Basophils Absolute: 0.1 10*3/uL (ref 0.0–0.1)
Basophils Relative: 1 %
EOS PCT: 1 %
Eosinophils Absolute: 0 10*3/uL (ref 0.0–0.5)
HCT: 33 % — ABNORMAL LOW (ref 36.0–46.0)
Hemoglobin: 9.9 g/dL — ABNORMAL LOW (ref 12.0–15.0)
Immature Granulocytes: 0 %
Lymphocytes Relative: 24 %
Lymphs Abs: 1.2 10*3/uL (ref 0.7–4.0)
MCH: 25.8 pg — ABNORMAL LOW (ref 26.0–34.0)
MCHC: 30 g/dL (ref 30.0–36.0)
MCV: 86.2 fL (ref 80.0–100.0)
Monocytes Absolute: 0.5 10*3/uL (ref 0.1–1.0)
Monocytes Relative: 9 %
Neutro Abs: 3.3 10*3/uL (ref 1.7–7.7)
Neutrophils Relative %: 65 %
Platelets: 342 10*3/uL (ref 150–400)
RBC: 3.83 MIL/uL — AB (ref 3.87–5.11)
RDW: 17.2 % — AB (ref 11.5–15.5)
WBC: 5.1 10*3/uL (ref 4.0–10.5)
nRBC: 0 % (ref 0.0–0.2)

## 2018-05-18 LAB — PREGNANCY, URINE: PREG TEST UR: NEGATIVE

## 2018-05-18 LAB — URINALYSIS, ROUTINE W REFLEX MICROSCOPIC
BACTERIA UA: NONE SEEN
Bilirubin Urine: NEGATIVE
Glucose, UA: NEGATIVE mg/dL
KETONES UR: 5 mg/dL — AB
Leukocytes,Ua: NEGATIVE
Nitrite: NEGATIVE
PROTEIN: 30 mg/dL — AB
Specific Gravity, Urine: 1.03 (ref 1.005–1.030)
pH: 5 (ref 5.0–8.0)

## 2018-05-18 LAB — COMPREHENSIVE METABOLIC PANEL
ALT: 18 U/L (ref 0–44)
ANION GAP: 7 (ref 5–15)
AST: 16 U/L (ref 15–41)
Albumin: 4.2 g/dL (ref 3.5–5.0)
Alkaline Phosphatase: 78 U/L (ref 38–126)
BUN: 8 mg/dL (ref 6–20)
CO2: 25 mmol/L (ref 22–32)
Calcium: 9.3 mg/dL (ref 8.9–10.3)
Chloride: 103 mmol/L (ref 98–111)
Creatinine, Ser: 0.77 mg/dL (ref 0.44–1.00)
GFR calc non Af Amer: >60 mL/min (ref >60)
Glucose, Bld: 96 mg/dL (ref 70–99)
POTASSIUM: 4.1 mmol/L (ref 3.5–5.1)
Sodium: 135 mmol/L (ref 135–145)
Total Bilirubin: 0.4 mg/dL (ref 0.3–1.2)
Total Protein: 8.7 g/dL — ABNORMAL HIGH (ref 6.5–8.1)

## 2018-05-18 LAB — I-STAT TROPONIN, ED: Troponin i, poc: 0 ng/mL (ref 0.00–0.08)

## 2018-05-18 MED ORDER — ONDANSETRON 4 MG PO TBDP
4.0000 mg | ORAL_TABLET | Freq: Once | ORAL | Status: AC
  Administered 2018-05-18: 4 mg via ORAL
  Filled 2018-05-18: qty 1

## 2018-05-18 MED ORDER — OXYBUTYNIN 3.9 MG/24HR TD PTTW: 1.0000 | MEDICATED_PATCH | TRANSDERMAL | 12 refills | Status: DC

## 2018-05-18 NOTE — Telephone Encounter (Signed)
Received transferred call from pt, stating Oxybuntynin patch are not covered by her Medicaid. Contacted [facility] and pharmacist able to switch to pill form which is a covered drug under Medicaid. Contacted pt to make aware. Jonnie Finner RN CCM Case Mgmt phone [phone]
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
## 2018-05-18 NOTE — Discharge Instructions (Addendum)
Evaluated today for chest pain as well as urinary frequency.  Your work-up was negative in department.  I would suggest keeping your appointment for your PCP which you stated you have next week.  I would also keep your appointment with psychiatry next week.  Return to the emergency department for any worsening symptoms.
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
## 2018-05-18 NOTE — ED Notes (Signed)
Patient transported to X-ray 

## 2018-10-18 ENCOUNTER — Ambulatory Visit: Payer: Medicare Other | Admitting: Family Medicine

## 2018-10-30 ENCOUNTER — Other Ambulatory Visit (HOSPITAL_COMMUNITY)
Admission: RE | Admit: 2018-10-30 | Discharge: 2018-10-30 | Disposition: A | Discharge status: Home / Self Care | Payer: Medicare Other | Source: Ambulatory Visit | Attending: Obstetrics and Gynecology | Admitting: Obstetrics and Gynecology

## 2018-10-30 ENCOUNTER — Encounter: Payer: Self-pay | Admitting: Obstetrics and Gynecology

## 2018-10-30 ENCOUNTER — Ambulatory Visit (INDEPENDENT_AMBULATORY_CARE_PROVIDER_SITE_OTHER): Payer: Medicare Other | Admitting: Obstetrics and Gynecology

## 2018-10-30 ENCOUNTER — Other Ambulatory Visit: Payer: Self-pay

## 2018-10-30 VITALS — BP 126/82 | HR 77 | Wt 194.0 lb

## 2018-10-30 DIAGNOSIS — Z113 Encounter for screening for infections with a predominantly sexual mode of transmission: Secondary | ICD-10-CM | POA: Insufficient documentation

## 2018-10-30 DIAGNOSIS — N920 Excessive and frequent menstruation with regular cycle: Secondary | ICD-10-CM

## 2018-10-30 NOTE — Progress Notes (Signed)
Pt states she has had cycle problems for years.
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
## 2018-10-31 LAB — HEPATITIS B SURFACE ANTIGEN: Hepatitis B Surface Ag: NEGATIVE

## 2018-10-31 LAB — HEPATITIS C ANTIBODY: Hep C Virus Ab: 0.1 s/co ratio (ref 0.0–0.9)

## 2018-10-31 LAB — HIV ANTIBODY (ROUTINE TESTING W REFLEX): HIV Screen 4th Generation wRfx: NONREACTIVE

## 2018-10-31 LAB — RPR: RPR Ser Ql: NONREACTIVE

## 2018-11-01 LAB — GC/CHLAMYDIA PROBE AMP (~~LOC~~) NOT AT ARMC
Chlamydia: NEGATIVE
Neisseria Gonorrhea: NEGATIVE

## 2018-11-08 ENCOUNTER — Ambulatory Visit (HOSPITAL_COMMUNITY)
Admission: RE | Admit: 2018-11-08 | Discharge: 2018-11-08 | Disposition: A | Discharge status: Home / Self Care | Payer: Medicare Other | Source: Ambulatory Visit | Attending: Obstetrics and Gynecology | Admitting: Obstetrics and Gynecology

## 2018-11-08 ENCOUNTER — Other Ambulatory Visit: Payer: Self-pay

## 2018-11-08 DIAGNOSIS — N920 Excessive and frequent menstruation with regular cycle: Secondary | ICD-10-CM

## 2018-11-27 ENCOUNTER — Other Ambulatory Visit: Payer: Self-pay

## 2018-11-27 ENCOUNTER — Encounter (HOSPITAL_COMMUNITY): Payer: Self-pay

## 2018-11-27 ENCOUNTER — Emergency Department (HOSPITAL_COMMUNITY): Payer: Medicare Other

## 2018-11-27 ENCOUNTER — Emergency Department (HOSPITAL_COMMUNITY)
Admission: EM | Admit: 2018-11-27 | Discharge: 2018-11-27 | Disposition: A | Discharge status: Home / Self Care | Payer: Medicare Other | Attending: Emergency Medicine | Admitting: Emergency Medicine

## 2018-11-27 DIAGNOSIS — F419 Anxiety disorder, unspecified: Secondary | ICD-10-CM | POA: Diagnosis not present

## 2018-11-27 DIAGNOSIS — R0602 Shortness of breath: Secondary | ICD-10-CM | POA: Insufficient documentation

## 2018-11-27 DIAGNOSIS — Z87891 Personal history of nicotine dependence: Secondary | ICD-10-CM | POA: Diagnosis not present

## 2018-11-27 DIAGNOSIS — Z79899 Other long term (current) drug therapy: Secondary | ICD-10-CM | POA: Insufficient documentation

## 2018-11-27 DIAGNOSIS — R0789 Other chest pain: Secondary | ICD-10-CM

## 2018-11-27 DIAGNOSIS — R110 Nausea: Secondary | ICD-10-CM | POA: Insufficient documentation

## 2018-11-27 DIAGNOSIS — I10 Essential (primary) hypertension: Secondary | ICD-10-CM | POA: Insufficient documentation

## 2018-11-27 LAB — TROPONIN I (HIGH SENSITIVITY): Troponin I (High Sensitivity): <2 ng/L (ref <18)

## 2018-11-27 LAB — CBC
HCT: 35.7 % — ABNORMAL LOW (ref 36.0–46.0)
Hemoglobin: 10.6 g/dL — ABNORMAL LOW (ref 12.0–15.0)
MCH: 25.3 pg — ABNORMAL LOW (ref 26.0–34.0)
MCHC: 29.7 g/dL — ABNORMAL LOW (ref 30.0–36.0)
MCV: 85.2 fL (ref 80.0–100.0)
Platelets: 320 10*3/uL (ref 150–400)
RBC: 4.19 MIL/uL (ref 3.87–5.11)
RDW: 18.6 % — ABNORMAL HIGH (ref 11.5–15.5)
WBC: 4.7 10*3/uL (ref 4.0–10.5)
nRBC: 0 % (ref 0.0–0.2)

## 2018-11-27 LAB — I-STAT BETA HCG BLOOD, ED (MC, WL, AP ONLY): I-stat hCG, quantitative: <5 m[IU]/mL (ref <5)

## 2018-11-27 LAB — BASIC METABOLIC PANEL
Anion gap: 9 (ref 5–15)
BUN: 8 mg/dL (ref 6–20)
CO2: 24 mmol/L (ref 22–32)
Calcium: 9.3 mg/dL (ref 8.9–10.3)
Chloride: 105 mmol/L (ref 98–111)
Creatinine, Ser: 0.73 mg/dL (ref 0.44–1.00)
GFR calc Af Amer: >60 mL/min (ref >60)
GFR calc non Af Amer: >60 mL/min (ref >60)
Glucose, Bld: 83 mg/dL (ref 70–99)
Potassium: 4 mmol/L (ref 3.5–5.1)
Sodium: 138 mmol/L (ref 135–145)

## 2018-11-27 MED ORDER — ACETAMINOPHEN 325 MG PO TABS
650.0000 mg | ORAL_TABLET | Freq: Once | ORAL | Status: AC
  Administered 2018-11-27: 650 mg via ORAL
  Filled 2018-11-27: qty 2

## 2018-11-27 MED ORDER — LORAZEPAM 2 MG/ML IJ SOLN
1.0000 mg | Freq: Once | INTRAMUSCULAR | Status: AC
  Administered 2018-11-27: 1 mg via INTRAVENOUS
  Filled 2018-11-27: qty 1

## 2018-11-27 MED ORDER — SODIUM CHLORIDE 0.9% FLUSH: 3.0000 mL | Freq: Once | INTRAVENOUS | Status: DC
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
## 2018-11-27 NOTE — ED Triage Notes (Signed)
Patient c/o left chest pain and SOB that started at 1100 today . Patient states she took a Xanax at 1115 and no relief from chest pain.
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
## 2018-11-27 NOTE — Discharge Instructions (Addendum)
Continue taking home medications as prescribed.  Follow up with your counselor.  Return to the ER with any new, worsening, or concerning symptoms. Return immediately if you have thoughts about wanting to hurt yourself or others.

## 2018-12-20 ENCOUNTER — Other Ambulatory Visit: Payer: Medicaid Other | Admitting: Obstetrics and Gynecology

## 2019-02-05 ENCOUNTER — Other Ambulatory Visit: Payer: Medicare Other | Admitting: Obstetrics and Gynecology

## 2019-02-25 ENCOUNTER — Other Ambulatory Visit: Payer: Self-pay | Admitting: Internal Medicine

## 2019-02-25 DIAGNOSIS — N644 Mastodynia: Secondary | ICD-10-CM

## 2019-03-11 ENCOUNTER — Other Ambulatory Visit: Payer: Medicare Other | Admitting: Obstetrics and Gynecology

## 2019-03-20 ENCOUNTER — Encounter (HOSPITAL_COMMUNITY): Payer: Self-pay

## 2019-03-20 ENCOUNTER — Emergency Department (HOSPITAL_COMMUNITY)
Admission: EM | Admit: 2019-03-20 | Discharge: 2019-03-20 | Disposition: A | Discharge status: Home / Self Care | Payer: Medicare Other | Attending: Emergency Medicine | Admitting: Emergency Medicine

## 2019-03-20 ENCOUNTER — Other Ambulatory Visit: Payer: Self-pay

## 2019-03-20 DIAGNOSIS — Z87891 Personal history of nicotine dependence: Secondary | ICD-10-CM | POA: Diagnosis not present

## 2019-03-20 DIAGNOSIS — I10 Essential (primary) hypertension: Secondary | ICD-10-CM | POA: Insufficient documentation

## 2019-03-20 DIAGNOSIS — K029 Dental caries, unspecified: Secondary | ICD-10-CM

## 2019-03-20 DIAGNOSIS — K0889 Other specified disorders of teeth and supporting structures: Secondary | ICD-10-CM | POA: Diagnosis not present

## 2019-03-20 MED ORDER — LIDOCAINE VISCOUS HCL 2 % MT SOLN
15.0000 mL | Freq: Once | OROMUCOSAL | Status: AC
  Administered 2019-03-20: 15 mL via ORAL
  Filled 2019-03-20: qty 15

## 2019-03-20 MED ORDER — PENICILLIN V POTASSIUM 500 MG PO TABS
500.0000 mg | ORAL_TABLET | Freq: Once | ORAL | Status: AC
  Administered 2019-03-20: 500 mg via ORAL
  Filled 2019-03-20: qty 1

## 2019-03-20 MED ORDER — PENICILLIN V POTASSIUM 500 MG PO TABS: 500.0000 mg | ORAL_TABLET | Freq: Three times a day (TID) | ORAL | 0 refills | Status: DC

## 2019-03-20 MED ORDER — ALUM & MAG HYDROXIDE-SIMETH 200-200-20 MG/5ML PO SUSP
30.0000 mL | Freq: Once | ORAL | Status: AC
  Administered 2019-03-20: 30 mL via ORAL
  Filled 2019-03-20: qty 30

## 2019-03-20 NOTE — Discharge Instructions (Signed)
Please call and follow-up closely with your dentist for further management of your dental pain.  Please avoid taking medication more than prescribed as it can significantly harm your health.  Take antibiotic as prescribed.
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
## 2019-03-20 NOTE — ED Triage Notes (Addendum)
Pt coming from home c/o left upper and lower dental pain. Has referral to surgeon but hasnt made appointment. Pt stated she has taken approx 15 ibuprofen 800mg  in the last 28 hours for pain without relief. Pt now experiencing abdominal pain. Alert and ambulatory. Also took her anxiety medication without relief.    Pt also reports she took 3 amoxicillins that were not hers, 5 zofrans, and approx 3 Fiorocet

## 2019-03-25 ENCOUNTER — Other Ambulatory Visit: Payer: Medicare Other | Admitting: Obstetrics and Gynecology

## 2019-09-30 ENCOUNTER — Other Ambulatory Visit: Payer: Self-pay | Admitting: Internal Medicine

## 2019-10-01 ENCOUNTER — Other Ambulatory Visit: Payer: Self-pay | Admitting: Internal Medicine

## 2019-10-01 DIAGNOSIS — N6320 Unspecified lump in the left breast, unspecified quadrant: Secondary | ICD-10-CM

## 2019-10-18 ENCOUNTER — Ambulatory Visit
Admission: RE | Admit: 2019-10-18 | Discharge: 2019-10-18 | Disposition: A | Discharge status: Home / Self Care | Payer: Medicare Other | Source: Ambulatory Visit | Attending: Internal Medicine | Admitting: Internal Medicine

## 2019-10-18 ENCOUNTER — Other Ambulatory Visit: Payer: Self-pay

## 2019-10-18 DIAGNOSIS — N6320 Unspecified lump in the left breast, unspecified quadrant: Secondary | ICD-10-CM

## 2019-11-04 ENCOUNTER — Other Ambulatory Visit: Payer: Medicaid Other | Admitting: Obstetrics and Gynecology

## 2020-01-16 IMAGING — CR DG CHEST 2V
2 series · 2 of 2 positions shown · non-contrast
Comparison: 05/18/2018

CLINICAL DATA: Chest pain and shortness of breath.

EXAM:
CHEST - 2 VIEW

[w chest pa]
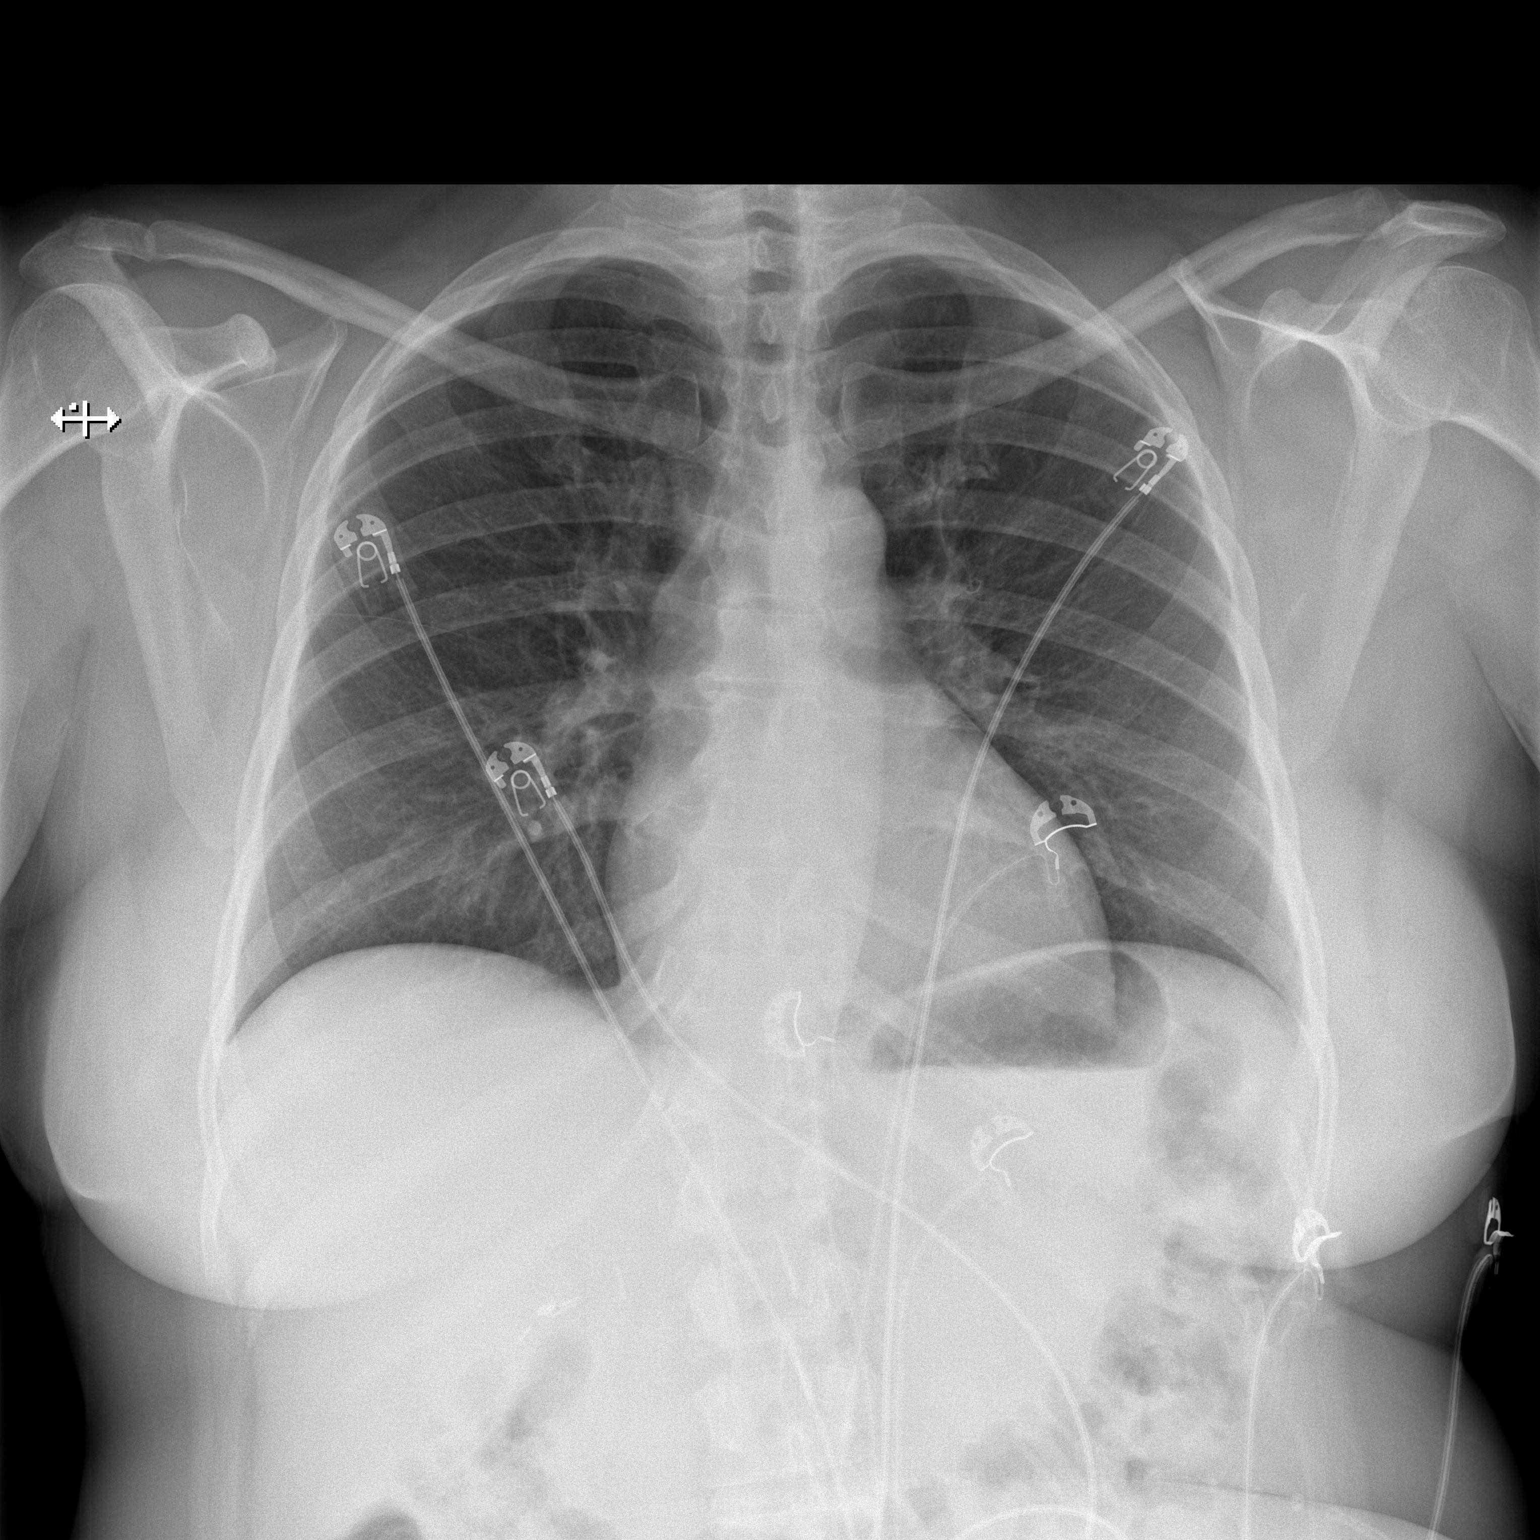

[w chest lat]
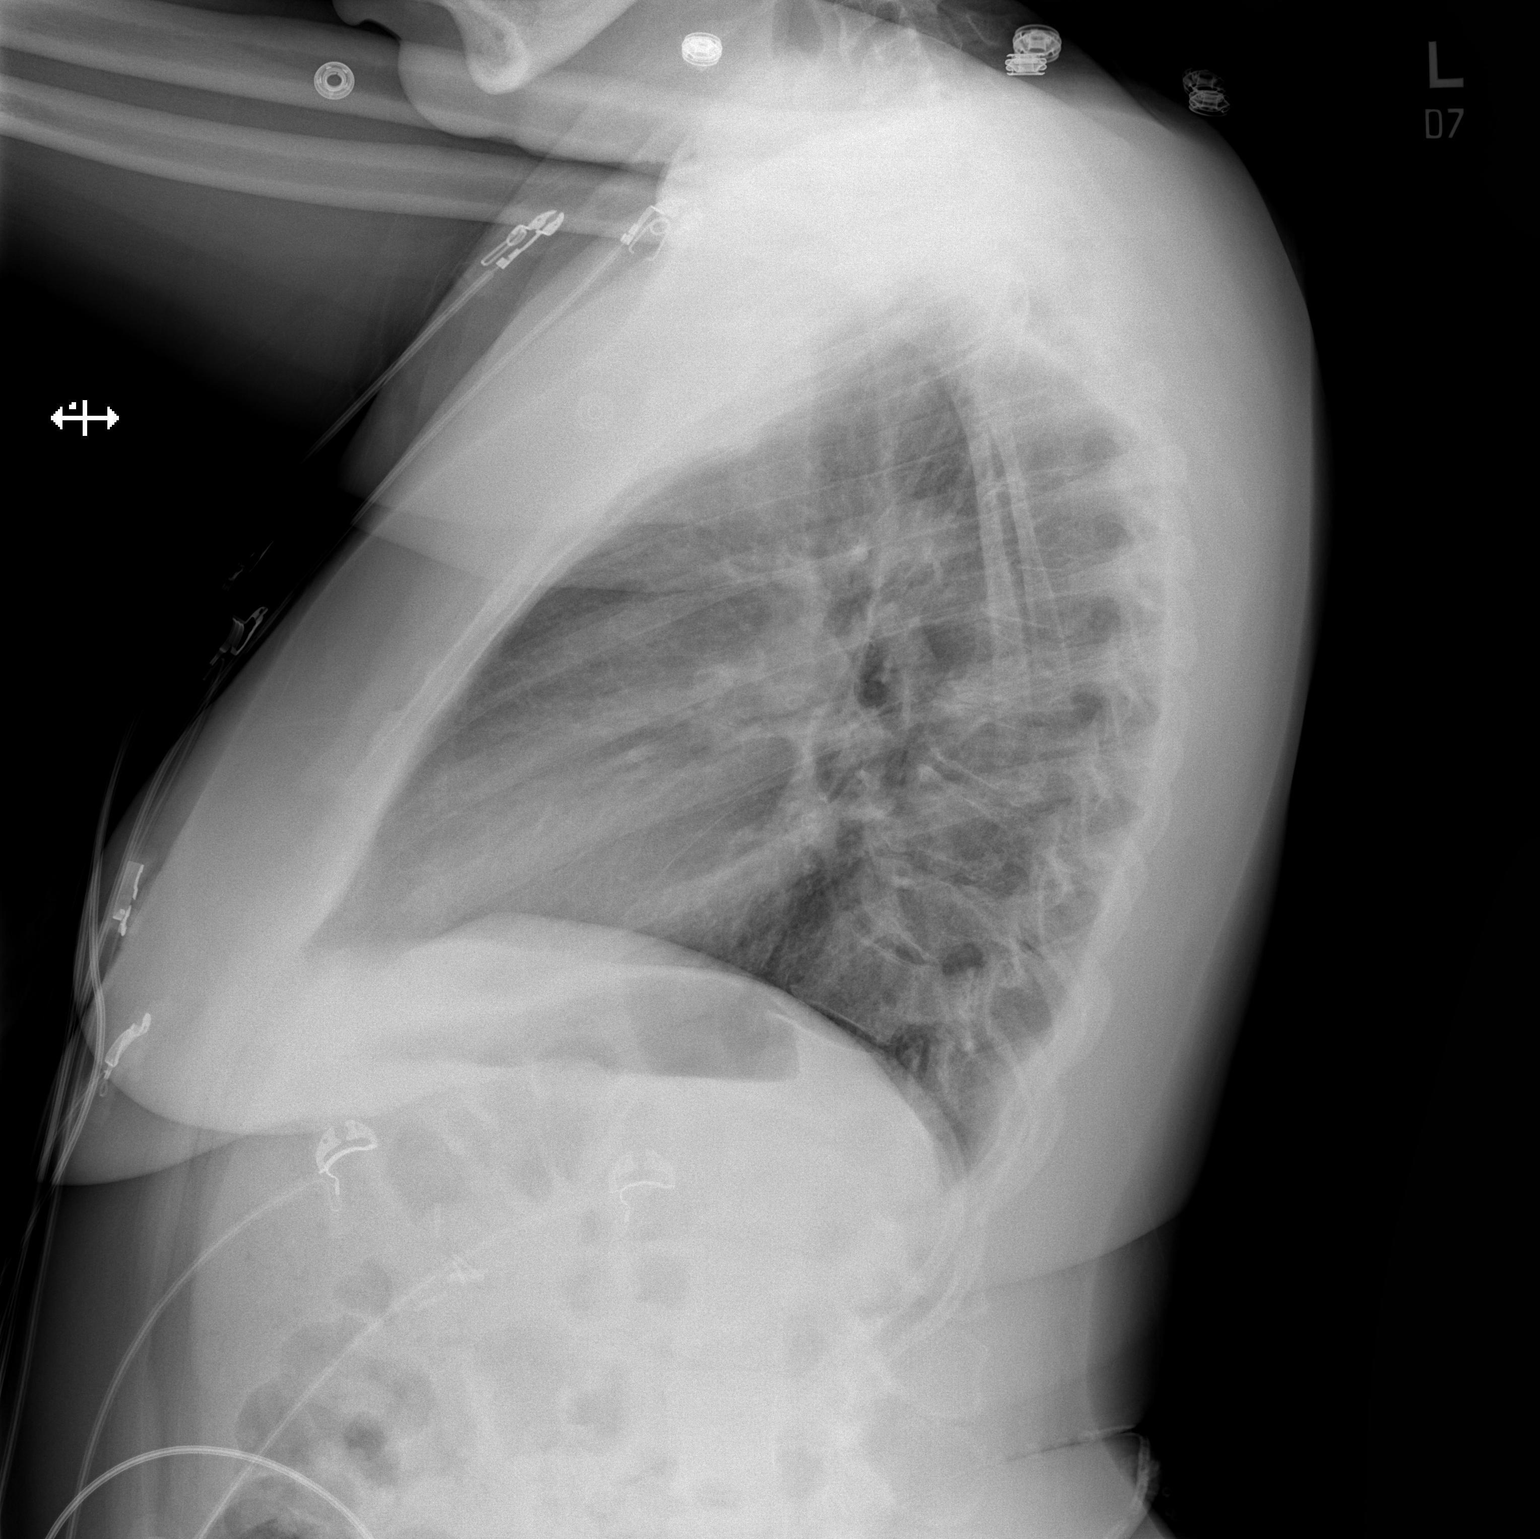

[2 of 2 positions shown; findings below may reference images not displayed]

FINDINGS: The heart size and mediastinal contours are within normal limits.
Both lungs are clear. The visualized skeletal structures are
unremarkable.
IMPRESSION: Normal exam.

## 2020-08-24 ENCOUNTER — Encounter (HOSPITAL_COMMUNITY): Payer: Self-pay | Admitting: Emergency Medicine

## 2020-08-24 ENCOUNTER — Emergency Department (HOSPITAL_COMMUNITY)
Admission: EM | Admit: 2020-08-24 | Discharge: 2020-08-25 | Disposition: A | Discharge status: Home / Self Care | Payer: Medicare Other | Attending: Emergency Medicine | Admitting: Emergency Medicine

## 2020-08-24 ENCOUNTER — Other Ambulatory Visit: Payer: Self-pay

## 2020-08-24 DIAGNOSIS — I10 Essential (primary) hypertension: Secondary | ICD-10-CM | POA: Diagnosis not present

## 2020-08-24 DIAGNOSIS — M79602 Pain in left arm: Secondary | ICD-10-CM | POA: Insufficient documentation

## 2020-08-24 DIAGNOSIS — Z87891 Personal history of nicotine dependence: Secondary | ICD-10-CM | POA: Insufficient documentation

## 2020-08-24 DIAGNOSIS — R110 Nausea: Secondary | ICD-10-CM | POA: Diagnosis not present

## 2020-08-24 LAB — BASIC METABOLIC PANEL
Anion gap: 7 (ref 5–15)
BUN: 12 mg/dL (ref 6–20)
CO2: 24 mmol/L (ref 22–32)
Calcium: 8.9 mg/dL (ref 8.9–10.3)
Chloride: 105 mmol/L (ref 98–111)
Creatinine, Ser: 0.87 mg/dL (ref 0.44–1.00)
GFR, Estimated: >60 mL/min (ref >60)
Glucose, Bld: 89 mg/dL (ref 70–99)
Potassium: 3.6 mmol/L (ref 3.5–5.1)
Sodium: 136 mmol/L (ref 135–145)

## 2020-08-24 LAB — CBC
HCT: 39.5 % (ref 36.0–46.0)
Hemoglobin: 12.8 g/dL (ref 12.0–15.0)
MCH: 29 pg (ref 26.0–34.0)
MCHC: 32.4 g/dL (ref 30.0–36.0)
MCV: 89.4 fL (ref 80.0–100.0)
Platelets: 368 10*3/uL (ref 150–400)
RBC: 4.42 MIL/uL (ref 3.87–5.11)
RDW: 16 % — ABNORMAL HIGH (ref 11.5–15.5)
WBC: 6.2 10*3/uL (ref 4.0–10.5)
nRBC: 0 % (ref 0.0–0.2)

## 2020-08-24 MED ORDER — HYDROXYZINE HCL 25 MG PO TABS
25.0000 mg | ORAL_TABLET | Freq: Once | ORAL | Status: AC
  Administered 2020-08-24: 25 mg via ORAL
  Filled 2020-08-24: qty 1

## 2020-08-24 MED ORDER — ALUM & MAG HYDROXIDE-SIMETH 200-200-20 MG/5ML PO SUSP
30.0000 mL | Freq: Once | ORAL | Status: AC
  Administered 2020-08-24: 30 mL via ORAL
  Filled 2020-08-24: qty 30

## 2020-08-24 MED ORDER — LIDOCAINE VISCOUS HCL 2 % MT SOLN
15.0000 mL | Freq: Once | OROMUCOSAL | Status: AC
  Administered 2020-08-24: 15 mL via ORAL
  Filled 2020-08-24: qty 15

## 2020-08-24 NOTE — ED Triage Notes (Addendum)
Patient is complaining of left arm pain that radiating down to her left leg. She states she does not know what is going on. She is also complaining of nausea. Patient is also complaining of in and out blurry vision.
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
## 2020-08-25 MED ORDER — HYDROXYZINE HCL 25 MG PO TABS
25.0000 mg | ORAL_TABLET | Freq: Four times a day (QID) | ORAL | 0 refills | Status: DC | PRN
Start: 2020-08-25 — End: 2024-02-12
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
## 2020-08-25 NOTE — Discharge Instructions (Addendum)
You were seen in the emergency department for worsening left arm pain and nausea.  You had lab work that did not show an obvious explanation of your symptoms.  You asked to start on some anxiety medicine.  We are prescribing you some hydroxyzine, if this seems to help you should talk to your primary care doctor about continuing it.

## 2020-10-15 ENCOUNTER — Other Ambulatory Visit: Payer: Self-pay | Admitting: Internal Medicine

## 2020-10-15 DIAGNOSIS — Z1231 Encounter for screening mammogram for malignant neoplasm of breast: Secondary | ICD-10-CM

## 2020-10-28 ENCOUNTER — Emergency Department (HOSPITAL_COMMUNITY): Payer: Medicare Other

## 2020-10-28 ENCOUNTER — Other Ambulatory Visit: Payer: Self-pay

## 2020-10-28 ENCOUNTER — Encounter (HOSPITAL_COMMUNITY): Payer: Self-pay

## 2020-10-28 ENCOUNTER — Emergency Department (HOSPITAL_COMMUNITY)
Admission: EM | Admit: 2020-10-28 | Discharge: 2020-10-28 | Disposition: A | Discharge status: Home / Self Care | Payer: Medicare Other | Attending: Emergency Medicine | Admitting: Emergency Medicine

## 2020-10-28 DIAGNOSIS — R109 Unspecified abdominal pain: Secondary | ICD-10-CM

## 2020-10-28 DIAGNOSIS — Z87891 Personal history of nicotine dependence: Secondary | ICD-10-CM | POA: Insufficient documentation

## 2020-10-28 DIAGNOSIS — I10 Essential (primary) hypertension: Secondary | ICD-10-CM | POA: Diagnosis not present

## 2020-10-28 DIAGNOSIS — K5792 Diverticulitis of intestine, part unspecified, without perforation or abscess without bleeding: Secondary | ICD-10-CM | POA: Diagnosis not present

## 2020-10-28 LAB — URINALYSIS, ROUTINE W REFLEX MICROSCOPIC
Bilirubin Urine: NEGATIVE
Glucose, UA: NEGATIVE mg/dL
Ketones, ur: NEGATIVE mg/dL
Leukocytes,Ua: NEGATIVE
Nitrite: NEGATIVE
Protein, ur: 100 mg/dL — AB
Specific Gravity, Urine: 1.026 (ref 1.005–1.030)
pH: 5 (ref 5.0–8.0)

## 2020-10-28 LAB — CBC WITH DIFFERENTIAL/PLATELET
Abs Immature Granulocytes: 0.01 10*3/uL (ref 0.00–0.07)
Basophils Absolute: 0.1 10*3/uL (ref 0.0–0.1)
Basophils Relative: 1 %
Eosinophils Absolute: 0 10*3/uL (ref 0.0–0.5)
Eosinophils Relative: 0 %
HCT: 41.4 % (ref 36.0–46.0)
Hemoglobin: 13.2 g/dL (ref 12.0–15.0)
Immature Granulocytes: 0 %
Lymphocytes Relative: 24 %
Lymphs Abs: 1.3 10*3/uL (ref 0.7–4.0)
MCH: 29.1 pg (ref 26.0–34.0)
MCHC: 31.9 g/dL (ref 30.0–36.0)
MCV: 91.4 fL (ref 80.0–100.0)
Monocytes Absolute: 0.4 10*3/uL (ref 0.1–1.0)
Monocytes Relative: 7 %
Neutro Abs: 3.5 10*3/uL (ref 1.7–7.7)
Neutrophils Relative %: 68 %
Platelets: 473 10*3/uL — ABNORMAL HIGH (ref 150–400)
RBC: 4.53 MIL/uL (ref 3.87–5.11)
RDW: 14.3 % (ref 11.5–15.5)
WBC: 5.2 10*3/uL (ref 4.0–10.5)
nRBC: 0 % (ref 0.0–0.2)

## 2020-10-28 LAB — BASIC METABOLIC PANEL
Anion gap: 10 (ref 5–15)
BUN: 5 mg/dL — ABNORMAL LOW (ref 6–20)
CO2: 26 mmol/L (ref 22–32)
Calcium: 9.4 mg/dL (ref 8.9–10.3)
Chloride: 102 mmol/L (ref 98–111)
Creatinine, Ser: 0.75 mg/dL (ref 0.44–1.00)
GFR, Estimated: >60 mL/min (ref >60)
Glucose, Bld: 96 mg/dL (ref 70–99)
Potassium: 4.1 mmol/L (ref 3.5–5.1)
Sodium: 138 mmol/L (ref 135–145)

## 2020-10-28 LAB — HEPATIC FUNCTION PANEL
ALT: 59 U/L — ABNORMAL HIGH (ref 0–44)
AST: 32 U/L (ref 15–41)
Albumin: 4 g/dL (ref 3.5–5.0)
Alkaline Phosphatase: 95 U/L (ref 38–126)
Bilirubin, Direct: 0.1 mg/dL (ref 0.0–0.2)
Indirect Bilirubin: 0.3 mg/dL (ref 0.3–0.9)
Total Bilirubin: 0.4 mg/dL (ref 0.3–1.2)
Total Protein: 8.4 g/dL — ABNORMAL HIGH (ref 6.5–8.1)

## 2020-10-28 MED ORDER — CIPROFLOXACIN HCL 500 MG PO TABS
500.0000 mg | ORAL_TABLET | Freq: Two times a day (BID) | ORAL | 0 refills | Status: AC
Start: 2020-10-28 — End: 2020-11-04

## 2020-10-28 MED ORDER — METRONIDAZOLE 500 MG PO TABS
500.0000 mg | ORAL_TABLET | Freq: Once | ORAL | Status: AC
  Administered 2020-10-28: 500 mg via ORAL
  Filled 2020-10-28: qty 1

## 2020-10-28 MED ORDER — IBUPROFEN 600 MG PO TABS: 600.0000 mg | ORAL_TABLET | Freq: Three times a day (TID) | ORAL | 0 refills | Status: AC | PRN

## 2020-10-28 MED ORDER — ACETAMINOPHEN 325 MG PO TABS: 650.0000 mg | ORAL_TABLET | Freq: Four times a day (QID) | ORAL | 0 refills | Status: AC | PRN

## 2020-10-28 MED ORDER — METRONIDAZOLE 500 MG PO TABS
500.0000 mg | ORAL_TABLET | Freq: Three times a day (TID) | ORAL | 0 refills | Status: AC
Start: 2020-10-28 — End: 2020-11-04

## 2020-10-28 MED ORDER — CIPROFLOXACIN HCL 500 MG PO TABS
500.0000 mg | ORAL_TABLET | Freq: Once | ORAL | Status: AC
  Administered 2020-10-28: 500 mg via ORAL
  Filled 2020-10-28: qty 1

## 2020-10-28 MED ORDER — KETOROLAC TROMETHAMINE 30 MG/ML IJ SOLN
30.0000 mg | Freq: Once | INTRAMUSCULAR | Status: AC
  Administered 2020-10-28: 30 mg via INTRAVENOUS
  Filled 2020-10-28: qty 1
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
## 2020-10-28 NOTE — Discharge Instructions (Addendum)
We started you on antibiotics for inflammation of your bowels called diverticulitis.  Your pain should begin to get better within the next 24 hours.  You can take tylenol and ibuprofen as prescribed for pain.  Your urine did have some blood and protein in it.  It is possible that you are dehydrated I recommend drinking more water.  If it still looks cloudy or dark after 1 week, you can schedule an appointment with a urologist.  I do not see any obvious signs of an infection on your urine sample today.

## 2020-10-28 NOTE — ED Triage Notes (Signed)
Covid + 8/3/222. Patient complaining of LLQ abd pain and dark urine.

## 2020-11-13 ENCOUNTER — Other Ambulatory Visit: Payer: Self-pay | Admitting: Gastroenterology

## 2020-11-13 ENCOUNTER — Other Ambulatory Visit (HOSPITAL_COMMUNITY): Payer: Self-pay | Admitting: Gastroenterology

## 2020-11-13 DIAGNOSIS — K5900 Constipation, unspecified: Secondary | ICD-10-CM

## 2020-11-25 ENCOUNTER — Ambulatory Visit (HOSPITAL_COMMUNITY)
Admission: RE | Admit: 2020-11-25 | Discharge: 2020-11-25 | Disposition: A | Discharge status: Home / Self Care | Payer: Medicare Other | Source: Ambulatory Visit | Attending: Gastroenterology | Admitting: Gastroenterology

## 2020-11-25 ENCOUNTER — Other Ambulatory Visit: Payer: Self-pay

## 2020-11-25 DIAGNOSIS — K5900 Constipation, unspecified: Secondary | ICD-10-CM | POA: Insufficient documentation

## 2020-11-25 MED ORDER — TECHNETIUM TC 99M SULFUR COLLOID
2.2000 | Freq: Once | INTRAVENOUS | Status: AC
  Administered 2020-11-25: 2.2 via ORAL

## 2020-12-03 ENCOUNTER — Ambulatory Visit: Payer: Medicaid Other

## 2021-04-28 ENCOUNTER — Other Ambulatory Visit: Payer: Self-pay

## 2021-04-28 ENCOUNTER — Encounter (HOSPITAL_COMMUNITY): Payer: Self-pay | Admitting: Emergency Medicine

## 2021-04-28 ENCOUNTER — Emergency Department (HOSPITAL_COMMUNITY)
Admission: EM | Admit: 2021-04-28 | Discharge: 2021-04-28 | Disposition: A | Discharge status: Home / Self Care | Payer: Medicare Other | Attending: Emergency Medicine | Admitting: Emergency Medicine

## 2021-04-28 ENCOUNTER — Emergency Department (HOSPITAL_COMMUNITY): Payer: Medicare Other

## 2021-04-28 DIAGNOSIS — Z79899 Other long term (current) drug therapy: Secondary | ICD-10-CM | POA: Insufficient documentation

## 2021-04-28 DIAGNOSIS — Y9339 Activity, other involving climbing, rappelling and jumping off: Secondary | ICD-10-CM | POA: Insufficient documentation

## 2021-04-28 DIAGNOSIS — X501XXA Overexertion from prolonged static or awkward postures, initial encounter: Secondary | ICD-10-CM | POA: Diagnosis not present

## 2021-04-28 DIAGNOSIS — S93401A Sprain of unspecified ligament of right ankle, initial encounter: Secondary | ICD-10-CM | POA: Diagnosis not present

## 2021-04-28 DIAGNOSIS — S99911A Unspecified injury of right ankle, initial encounter: Secondary | ICD-10-CM | POA: Diagnosis present

## 2021-04-28 DIAGNOSIS — Y990 Civilian activity done for income or pay: Secondary | ICD-10-CM | POA: Diagnosis not present

## 2021-04-28 MED ORDER — ACETAMINOPHEN 325 MG PO TABS
650.0000 mg | ORAL_TABLET | Freq: Once | ORAL | Status: AC
  Administered 2021-04-28: 650 mg via ORAL
  Filled 2021-04-28: qty 2

## 2021-04-28 MED ORDER — IBUPROFEN 200 MG PO TABS
600.0000 mg | ORAL_TABLET | Freq: Once | ORAL | Status: AC
  Administered 2021-04-28: 600 mg via ORAL
  Filled 2021-04-28: qty 3

## 2021-04-28 NOTE — ED Triage Notes (Signed)
States she got some exciting news at work and kind of jumped, rolled her R ankle inward and heard a pop. Minor swelling to ankle noted w/ tenderness.

## 2021-04-28 NOTE — ED Provider Notes (Signed)
[facility] DEPT Provider Note   CSN: [id number] Arrival date & time: 04/28/21  1721     History  No chief complaint on file.   Leah Fox is a 56 y.o. female.  Patient complains of right ankle pain.  Today she states she was jumping when she landed funny onto her right ankle and felt to roll inward and heard a pop.  Denies injury elsewhere.  No loss of consciousness no other extremity pain.  No fever cough vomiting or diarrhea.  Stating she has worsening pain when she tries to walk on the right ankle.      Home Medications Prior to Admission medications   Medication Sig Start Date End Date Taking? Authorizing Provider  acetaminophen (TYLENOL) 325 MG tablet Take 2 tablets (650 mg total) by mouth every 6 (six) hours as needed for up to 30 doses for mild pain or moderate pain. 10/28/20   Wyvonnia Dusky, MD  acetaminophen (TYLENOL) 500 MG tablet Take 1,000 mg by mouth every 6 (six) hours as needed for mild pain.    [provider]  BLISOVI FE 1/20 1-20 MG-MCG tablet Take 1 tablet by mouth daily. 09/08/20   [provider]  Cyanocobalamin (VITAMIN B 12) 250 MCG LOZG Take 500 mcg by mouth daily.    [provider]  diphenhydrAMINE (BENADRYL) 25 MG tablet Take 25 mg by mouth daily as needed for itching or allergies.    [provider]  fluticasone (FLONASE) 50 MCG/ACT nasal spray Place 2 sprays into both nostrils 2 (two) times daily as needed for allergies.    [provider]  hydrOXYzine (ATARAX/VISTARIL) 25 MG tablet Take 1 tablet (25 mg total) by mouth every 6 (six) hours as needed for anxiety. Patient not taking: No sig reported 08/25/20   Hayden Rasmussen, MD  ibuprofen (ADVIL) 600 MG tablet Take 1 tablet (600 mg total) by mouth every 8 (eight) hours as needed for up to 21 doses for moderate pain or mild pain. 10/28/20   Wyvonnia Dusky, MD  naproxen sodium (ALEVE) 220 MG tablet Take 880 mg by mouth 2  (two) times daily as needed (pain).    [provider]  ondansetron (ZOFRAN ODT) 4 MG disintegrating tablet Take 1 tablet (4 mg total) by mouth every 8 (eight) hours as needed for nausea or vomiting. Patient not taking: Reported on 10/28/2020 01/07/15   Nona Dell, PA-C  ondansetron (ZOFRAN-ODT) 8 MG disintegrating tablet Take 8 mg by mouth every 8 (eight) hours as needed for nausea or vomiting.    [provider]  oxybutynin (DITROPAN) 5 MG tablet Take 5 mg by mouth 2 (two) times daily.    [provider]  oxybutynin (OXYTROL) 3.9 MG/24HR Place 1 patch onto the skin every 3 (three) days. Patient not taking: No sig reported 05/18/18   Henderly, Britni A, PA-C  penicillin v potassium (VEETID) 500 MG tablet Take 1 tablet (500 mg total) by mouth 3 (three) times daily. Patient not taking: No sig reported 03/20/19   Domenic Moras, PA-C  SAFETY-LOK INSULIN SYR 1CC/29G 29G X 1/2" 1 ML MISC USE AS DIRECTED 01/28/15   Tomi Likens, Adam R, DO  sucralfate (CARAFATE) 1 g tablet Take 1 g by mouth 2 (two) times daily.    [provider]      Allergies    Bupropion, Nortriptyline hcl, Other, Oxycodone-acetaminophen, and Percocet [oxycodone-acetaminophen]    Review of Systems   Review of Systems  Constitutional:  Negative for fever.  HENT:  Negative for ear pain.   Eyes:  Negative for pain.  Respiratory:  Negative for cough.   Cardiovascular:  Negative for chest pain.  Gastrointestinal:  Negative for abdominal pain.  Genitourinary:  Negative for flank pain.  Musculoskeletal:  Negative for back pain.  Skin:  Negative for rash.  Neurological:  Negative for headaches.   Physical Exam Updated Vital Signs BP 138/84 (BP Location: Left Arm)    Pulse 85    Temp 98 F (36.7 C) (Oral)    Resp 16    SpO2 99%  Physical Exam Constitutional:      General: She is not in acute distress.    Appearance: Normal appearance.  HENT:     Head: Normocephalic.     Nose: Nose  normal.  Eyes:     Extraocular Movements: Extraocular movements intact.  Cardiovascular:     Rate and Rhythm: Normal rate.  Pulmonary:     Effort: Pulmonary effort is normal.  Musculoskeletal:     Cervical back: Normal range of motion.     Comments: Right ankle appears swollen.  Tender to the lateral ankle joint.  Lateral malleoli regions tender.  Otherwise neurovascular intact compartments are soft.  Neurological:     General: No focal deficit present.     Mental Status: She is alert. Mental status is at baseline.    ED Results / Procedures / Treatments   Labs (all labs ordered are listed, but only abnormal results are displayed) Labs Reviewed - No data to display  EKG None  Radiology DG Ankle Complete Right  Result Date: 04/28/2021 CLINICAL DATA:  Right ankle injury, swelling and tenderness EXAM: RIGHT ANKLE - COMPLETE 3+ VIEW COMPARISON:  None. FINDINGS: Frontal, oblique, lateral views of the right ankle are obtained. No acute fracture, subluxation, or dislocation. Joint spaces are well preserved. Small inferior calcaneal spur. Mild anterior soft tissue swelling. IMPRESSION: 1. Mild anterior soft tissue swelling.  No acute fracture. Electronically Signed   By: Randa Ngo M.D.   On: 04/28/2021 17:53    Procedures .Ortho Injury Treatment  Date/Time: 04/28/2021 5:58 PM Performed by: Luna Fuse, MD Authorized by: Luna Fuse, MD  Immobilization: crutches (Ace wrap) Post-procedure neurovascular assessment: post-procedure neurovascularly intact      Medications Ordered in ED Medications  acetaminophen (TYLENOL) tablet 650 mg (has no administration in time range)  ibuprofen (ADVIL) tablet 600 mg (has no administration in time range)    ED Course/ Medical Decision Making/ A&P                           Medical Decision Making Amount and/or Complexity of Data Reviewed Radiology: ordered.   Review of records shows primary care visit March 31, 2021 and  discharged home.  Work-up today included x-rays of the ankle showing no acute fracture dislocation.  Positive soft tissue swelling noted.  Patient placed in an Ace wrap and crutch training provided.  Advised outpatient follow-up with a doctor within the week.  Advise return for worsening pain or any additional concerns.         Final Clinical Impression(s) / ED Diagnoses Final diagnoses:  Sprain of right ankle, unspecified ligament, initial encounter    Rx / DC Orders ED Discharge Orders     None         Luna Fuse, MD 04/28/21 1800

## 2021-04-28 NOTE — Discharge Instructions (Signed)
Call your primary care doctor or specialist as discussed in the next 2-3 days.   Return immediately back to the ER if:  Your symptoms worsen within the next 12-24 hours. You develop new symptoms such as new fevers, persistent vomiting, new pain, shortness of breath, or new weakness or numbness, or if you have any other concerns.  

## 2021-08-03 ENCOUNTER — Encounter: Payer: Self-pay | Admitting: Internal Medicine

## 2021-08-31 ENCOUNTER — Encounter: Payer: Self-pay | Admitting: Internal Medicine

## 2021-09-30 ENCOUNTER — Encounter: Payer: Self-pay | Admitting: *Deleted

## 2021-11-01 ENCOUNTER — Encounter: Payer: Medicare Other | Admitting: Internal Medicine

## 2021-12-13 ENCOUNTER — Other Ambulatory Visit: Payer: Self-pay | Admitting: Internal Medicine

## 2021-12-14 LAB — COMPLETE METABOLIC PANEL WITH GFR
AG Ratio: 1.2 (calc) (ref 1.0–2.5)
ALT: 18 U/L (ref 6–29)
AST: 16 U/L (ref 10–35)
Albumin: 4.1 g/dL (ref 3.6–5.1)
Alkaline phosphatase (APISO): 67 U/L (ref 37–153)
BUN/Creatinine Ratio: 16 (calc) (ref 6–22)
BUN: 17 mg/dL (ref 7–25)
CO2: 21 mmol/L (ref 20–32)
Calcium: 9.4 mg/dL (ref 8.6–10.4)
Chloride: 104 mmol/L (ref 98–110)
Creat: 1.08 mg/dL — ABNORMAL HIGH (ref 0.50–1.03)
Globulin: 3.3 g/dL (calc) (ref 1.9–3.7)
Glucose, Bld: 123 mg/dL — ABNORMAL HIGH (ref 65–99)
Potassium: 4.1 mmol/L (ref 3.5–5.3)
Sodium: 139 mmol/L (ref 135–146)
Total Bilirubin: 0.2 mg/dL (ref 0.2–1.2)
Total Protein: 7.4 g/dL (ref 6.1–8.1)
eGFR: 61 mL/min/{1.73_m2} (ref >=60)

## 2021-12-14 LAB — CBC
HCT: 37.1 % (ref 35.0–45.0)
Hemoglobin: 12.7 g/dL (ref 11.7–15.5)
MCH: 31.2 pg (ref 27.0–33.0)
MCHC: 34.2 g/dL (ref 32.0–36.0)
MCV: 91.2 fL (ref 80.0–100.0)
MPV: 10.1 fL (ref 7.5–12.5)
Platelets: 328 10*3/uL (ref 140–400)
RBC: 4.07 10*6/uL (ref 3.80–5.10)
RDW: 13.8 % (ref 11.0–15.0)
WBC: 5 10*3/uL (ref 3.8–10.8)

## 2021-12-14 LAB — TSH: TSH: 1.33 mIU/L

## 2021-12-14 LAB — LIPID PANEL
Cholesterol: 196 mg/dL (ref <200)
HDL: 75 mg/dL (ref >=50)
LDL Cholesterol (Calc): 85 mg/dL (calc)
Non-HDL Cholesterol (Calc): 121 mg/dL (calc) (ref <130)
Total CHOL/HDL Ratio: 2.6 (calc) (ref <5.0)
Triglycerides: 275 mg/dL — ABNORMAL HIGH (ref <150)

## 2021-12-14 LAB — VITAMIN D 25 HYDROXY (VIT D DEFICIENCY, FRACTURES): Vit D, 25-Hydroxy: 17 ng/mL — ABNORMAL LOW (ref 30–100)

## 2022-12-12 ENCOUNTER — Ambulatory Visit: Payer: 59 | Admitting: Podiatry

## 2022-12-13 ENCOUNTER — Encounter: Payer: Self-pay | Admitting: Internal Medicine

## 2022-12-13 DIAGNOSIS — Z1231 Encounter for screening mammogram for malignant neoplasm of breast: Secondary | ICD-10-CM

## 2022-12-19 ENCOUNTER — Other Ambulatory Visit: Payer: Self-pay | Admitting: Internal Medicine

## 2022-12-19 DIAGNOSIS — Z1231 Encounter for screening mammogram for malignant neoplasm of breast: Secondary | ICD-10-CM

## 2024-02-12 ENCOUNTER — Ambulatory Visit: Admitting: Podiatry

## 2024-02-12 DIAGNOSIS — B351 Tinea unguium: Secondary | ICD-10-CM | POA: Diagnosis not present

## 2024-02-12 MED ORDER — TERBINAFINE HCL 250 MG PO TABS: 250.0000 mg | ORAL_TABLET | Freq: Every day | ORAL | 0 refills | Status: AC

## 2024-02-13 NOTE — Progress Notes (Signed)
 Subjective:   Patient ID: Leah Fox, female   DOB: 58 y.o.   MRN: [id number]   HPI The patient presents concerned about nail disease thickness and the possibility for ingrown toenail.  States that the left hallux especially gives her trouble and patient does not currently smoke tries to be active.  Patient states that she feels like it occurred after a pedicure 9 months ago   Review of Systems  All other systems reviewed and are negative.       Objective:  Physical Exam Vitals and nursing note reviewed.  Constitutional:      Appearance: She is well-developed.  Pulmonary:     Effort: Pulmonary effort is normal.  Musculoskeletal:        General: Normal range of motion.  Skin:    General: Skin is warm.  Neurological:     Mental Status: She is alert.     Neurovascular status found to be intact muscle strength found to be adequate range of motion adequate patient stated that she had blood work done in the last 2 months and that all her numbers were normal.  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  She would like to try something to get rid of the fungus and see if the nail will grow better and I did recommend oral Lamisil  and I did explain the risk of taking this medicine and she is willing to accept that risk and states that she did have normal blood work and she is going to bring me a copy also.  I did write her for Lamisil  to take 1 pill a day for 90 days all questions answered and any issues were to occur she is to let us  know immediately

## 2024-02-19 ENCOUNTER — Telehealth: Payer: Self-pay

## 2024-02-19 NOTE — Telephone Encounter (Signed)
 Patient would like advice on when to start prescription of terbinafine  after receiving lab results for Lipid panel and Vitamin D  test (out of range)?
